# Patient Record
Sex: Female | Born: 1960 | Race: White | Hispanic: No | Marital: Married | Smoking: Light tobacco smoker
Health system: Southern US, Community
[De-identification: ages and names within clinical notes are randomized; demographics above are authoritative.]

## PROBLEM LIST (undated history)

## (undated) DIAGNOSIS — I10 Essential (primary) hypertension: Secondary | ICD-10-CM

## (undated) DIAGNOSIS — Z95820 Peripheral vascular angioplasty status with implants and grafts: Secondary | ICD-10-CM

## (undated) DIAGNOSIS — I2510 Atherosclerotic heart disease of native coronary artery without angina pectoris: Secondary | ICD-10-CM

## (undated) DIAGNOSIS — K219 Gastro-esophageal reflux disease without esophagitis: Secondary | ICD-10-CM

## (undated) DIAGNOSIS — R9439 Abnormal result of other cardiovascular function study: Secondary | ICD-10-CM

## (undated) DIAGNOSIS — J189 Pneumonia, unspecified organism: Secondary | ICD-10-CM

## (undated) DIAGNOSIS — E785 Hyperlipidemia, unspecified: Secondary | ICD-10-CM

## (undated) DIAGNOSIS — J42 Unspecified chronic bronchitis: Secondary | ICD-10-CM

## (undated) DIAGNOSIS — F419 Anxiety disorder, unspecified: Secondary | ICD-10-CM

## (undated) HISTORY — PX: BACK SURGERY: SHX140

## (undated) HISTORY — PX: REFRACTIVE SURGERY: SHX103

## (undated) HISTORY — PX: ANTERIOR CERVICAL DECOMP/DISCECTOMY FUSION: SHX1161

## (undated) HISTORY — PX: TONSILLECTOMY: SUR1361

---

## 1999-06-07 ENCOUNTER — Other Ambulatory Visit: Admission: RE | Admit: 1999-06-07 | Discharge: 1999-06-07 | Payer: Self-pay | Admitting: *Deleted

## 1999-06-07 ENCOUNTER — Encounter (INDEPENDENT_AMBULATORY_CARE_PROVIDER_SITE_OTHER): Payer: Self-pay

## 1999-12-17 ENCOUNTER — Other Ambulatory Visit: Admission: RE | Admit: 1999-12-17 | Discharge: 1999-12-17 | Payer: Self-pay | Admitting: *Deleted

## 2000-12-15 ENCOUNTER — Other Ambulatory Visit: Admission: RE | Admit: 2000-12-15 | Discharge: 2000-12-15 | Payer: Self-pay | Admitting: *Deleted

## 2002-05-10 ENCOUNTER — Other Ambulatory Visit: Admission: RE | Admit: 2002-05-10 | Discharge: 2002-05-10 | Payer: Self-pay | Admitting: *Deleted

## 2003-05-16 ENCOUNTER — Other Ambulatory Visit: Admission: RE | Admit: 2003-05-16 | Discharge: 2003-05-16 | Payer: Self-pay | Admitting: *Deleted

## 2004-09-29 ENCOUNTER — Ambulatory Visit (HOSPITAL_COMMUNITY): Admission: RE | Admit: 2004-09-29 | Discharge: 2004-09-29 | Payer: Self-pay | Admitting: Family Medicine

## 2005-01-28 ENCOUNTER — Other Ambulatory Visit: Admission: RE | Admit: 2005-01-28 | Discharge: 2005-01-28 | Payer: Self-pay | Admitting: Family Medicine

## 2005-03-04 ENCOUNTER — Encounter: Admission: RE | Admit: 2005-03-04 | Discharge: 2005-03-04 | Payer: Self-pay | Admitting: Gastroenterology

## 2006-06-09 ENCOUNTER — Ambulatory Visit (HOSPITAL_COMMUNITY): Admission: RE | Admit: 2006-06-09 | Discharge: 2006-06-10 | Payer: Self-pay | Admitting: Neurosurgery

## 2006-10-26 ENCOUNTER — Encounter: Admission: RE | Admit: 2006-10-26 | Discharge: 2006-10-26 | Payer: Self-pay | Admitting: Neurosurgery

## 2007-12-13 ENCOUNTER — Other Ambulatory Visit: Admission: RE | Admit: 2007-12-13 | Discharge: 2007-12-13 | Payer: Self-pay | Admitting: Family Medicine

## 2009-01-23 ENCOUNTER — Other Ambulatory Visit: Admission: RE | Admit: 2009-01-23 | Discharge: 2009-01-23 | Payer: Self-pay | Admitting: Family Medicine

## 2011-01-07 NOTE — Op Note (Signed)
Rebecca Dalton, Rebecca Dalton                ACCOUNT NO.:  [id number]   MEDICAL RECORD NO.:  [id number]          PATIENT TYPE:  AMB   LOCATION:  SDS                          FACILITY:  [facility]   PHYSICIAN:  Henry A. Pool, M.D.    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   PROCEDURE PERFORMED:  C6-7 anterior cervical diskectomy and fusion with  allograft and plating.   SURGEON:  Kathaleen Maser. Pool, M.D.   Threasa HeadsYetta Barre.   ANESTHESIA:  General endotracheal.   INDICATIONS FOR SURGERY:  [name] is a 50 year old female with a  history  of neck and left upper extremity pain consistent with a left-sided C7  radiculopathy.  The patient has failed conservative management.  She  presents now for C6-7 anterior cervical diskectomy and fusion with allograft  and plating of reversing preoperative patient operative table in supine  position after goal was achieved the patient prone position supine.  Slightly extended held with halter traction.  The anterior cervix.  Paracervical was second incision overlying C6-7 interspace.  This carried  sharply to the platysma.  This then divided vertically section C5 medial  border of sternomastoid muscle and carotid sheath.  Trachea, esophagus were  mobilized turned towards the left.  Prevertebral fascia stripped off  anterior spinal column.  Long close and close then elevated bilaterally.  Deep self self-retaining space entrapped fluoroscopy C6-7 level was  confirmed.  Disk space and a size 15 blade fracture with a space clean-out  was achieved using pituitary rongeurs for a maxillary, curettes, Kerrison  rongeurs, high-speed drill, disk removed down to the posterior annulus.  Microscope was then brought to  the.  The remainder diskectomy were and six  remaining aspects of annulus sides removed down to the posterolateral.  This  also was elevated and resected fashion using Kerrison rongeurs for thecal  sac was identified.  Wide central decompression then performed by  undercutting the bodies of C6-C7 using Kerrison rongeurs.  Decompression  then proceed H each neural foramen.  Wide anterior foraminotomies were  performed along the course of the exiting C7 nerve roots bilaterally.  A  large amount of free.  We ruptured disk herniation off to the left-sided T6-  7 was encountered.  It was resected.  This point a very thorough  decompression then achieved.  This does injury to the thecal sac or nerve  roots.  We then irrigated solution.  A 5 mm Life allograft wedge then packed  into place and recessed proximal 1 mm anterior cortical margin.  25 mm  anterior plate was then placed over the C6-C7 levels.  This attached under  fluoroscopic guidance using 13 mm variable screws to each of both levels.  All four screws given final tightening.  Locking screws engaged at both  levels.  Final images revealed good position.  Femoral proper pole normal spine.  Was then irrigated solution for  hemostasis was achieved with cautery with a bipolar cautery.  Was then  closed typical fashion.  Steri-Strips ureters were applied.   OPERATION:  The patient well and she returns for postoperative care.  Morris  Gaynell Face elements thanks           ______________________________  Kathaleen Maser. Pool, M.D.     HAP/MEDQ  D:  06/09/2006  T:  06/11/2006  Job:  [id number]

## 2012-01-31 ENCOUNTER — Other Ambulatory Visit: Payer: Self-pay | Admitting: Chiropractic Medicine

## 2012-01-31 DIAGNOSIS — M5430 Sciatica, unspecified side: Secondary | ICD-10-CM

## 2012-02-03 ENCOUNTER — Other Ambulatory Visit: Payer: Self-pay

## 2012-02-04 ENCOUNTER — Ambulatory Visit
Admission: RE | Admit: 2012-02-04 | Discharge: 2012-02-04 | Disposition: A | Discharge status: Home / Self Care | Payer: BC Managed Care – PPO | Source: Ambulatory Visit | Attending: Chiropractic Medicine | Admitting: Chiropractic Medicine

## 2012-02-04 DIAGNOSIS — M5430 Sciatica, unspecified side: Secondary | ICD-10-CM

## 2012-02-10 ENCOUNTER — Ambulatory Visit
Admission: RE | Admit: 2012-02-10 | Discharge: 2012-02-10 | Disposition: A | Discharge status: Home / Self Care | Payer: BC Managed Care – PPO | Source: Ambulatory Visit | Attending: Chiropractic Medicine | Admitting: Chiropractic Medicine

## 2012-02-10 ENCOUNTER — Other Ambulatory Visit: Payer: Self-pay | Admitting: Chiropractic Medicine

## 2012-02-10 DIAGNOSIS — M25559 Pain in unspecified hip: Secondary | ICD-10-CM

## 2012-02-17 ENCOUNTER — Other Ambulatory Visit: Payer: Self-pay | Admitting: Family Medicine

## 2012-02-17 ENCOUNTER — Other Ambulatory Visit (HOSPITAL_COMMUNITY)
Admission: RE | Admit: 2012-02-17 | Discharge: 2012-02-17 | Disposition: A | Discharge status: Home / Self Care | Payer: BC Managed Care – PPO | Source: Ambulatory Visit | Attending: Family Medicine | Admitting: Family Medicine

## 2012-02-17 DIAGNOSIS — Z01419 Encounter for gynecological examination (general) (routine) without abnormal findings: Secondary | ICD-10-CM | POA: Insufficient documentation

## 2012-07-23 ENCOUNTER — Other Ambulatory Visit: Payer: Self-pay | Admitting: Family Medicine

## 2012-07-23 DIAGNOSIS — R945 Abnormal results of liver function studies: Secondary | ICD-10-CM

## 2012-07-27 ENCOUNTER — Ambulatory Visit
Admission: RE | Admit: 2012-07-27 | Discharge: 2012-07-27 | Disposition: A | Discharge status: Home / Self Care | Payer: BC Managed Care – PPO | Source: Ambulatory Visit | Attending: Family Medicine | Admitting: Family Medicine

## 2012-07-27 DIAGNOSIS — R945 Abnormal results of liver function studies: Secondary | ICD-10-CM

## 2012-08-10 ENCOUNTER — Other Ambulatory Visit: Payer: Self-pay | Admitting: Family Medicine

## 2012-08-10 ENCOUNTER — Ambulatory Visit
Admission: RE | Admit: 2012-08-10 | Discharge: 2012-08-10 | Disposition: A | Discharge status: Home / Self Care | Payer: BC Managed Care – PPO | Source: Ambulatory Visit | Attending: Family Medicine | Admitting: Family Medicine

## 2012-08-10 DIAGNOSIS — M533 Sacrococcygeal disorders, not elsewhere classified: Secondary | ICD-10-CM

## 2012-08-10 MED ORDER — IOHEXOL 300 MG/ML  SOLN
100.0000 mL | Freq: Once | INTRAMUSCULAR | Status: AC | PRN
  Administered 2012-08-10: 100 mL via INTRAVENOUS

## 2013-09-06 ENCOUNTER — Other Ambulatory Visit: Payer: Self-pay | Admitting: Family Medicine

## 2013-09-06 DIAGNOSIS — R1031 Right lower quadrant pain: Secondary | ICD-10-CM

## 2013-09-13 ENCOUNTER — Ambulatory Visit
Admission: RE | Admit: 2013-09-13 | Discharge: 2013-09-13 | Disposition: A | Discharge status: Home / Self Care | Payer: BC Managed Care – PPO | Source: Ambulatory Visit | Attending: Family Medicine | Admitting: Family Medicine

## 2013-09-13 ENCOUNTER — Ambulatory Visit
Admission: RE | Admit: 2013-09-13 | Discharge: 2013-09-13 | Disposition: A | Discharge status: Home / Self Care | Payer: PRIVATE HEALTH INSURANCE | Source: Ambulatory Visit | Attending: Family Medicine | Admitting: Family Medicine

## 2013-09-13 DIAGNOSIS — R1031 Right lower quadrant pain: Secondary | ICD-10-CM

## 2014-04-22 ENCOUNTER — Other Ambulatory Visit: Payer: Self-pay | Admitting: Family Medicine

## 2014-04-22 DIAGNOSIS — M79605 Pain in left leg: Principal | ICD-10-CM

## 2014-04-22 DIAGNOSIS — M79604 Pain in right leg: Secondary | ICD-10-CM

## 2014-04-30 ENCOUNTER — Ambulatory Visit
Admission: RE | Admit: 2014-04-30 | Discharge: 2014-04-30 | Disposition: A | Discharge status: Home / Self Care | Payer: PRIVATE HEALTH INSURANCE | Source: Ambulatory Visit | Attending: Family Medicine | Admitting: Family Medicine

## 2014-04-30 DIAGNOSIS — M79604 Pain in right leg: Secondary | ICD-10-CM

## 2014-04-30 DIAGNOSIS — M79605 Pain in left leg: Principal | ICD-10-CM

## 2014-08-25 ENCOUNTER — Other Ambulatory Visit (HOSPITAL_COMMUNITY): Payer: Self-pay | Admitting: Gastroenterology

## 2014-08-25 DIAGNOSIS — D75839 Thrombocytosis, unspecified: Secondary | ICD-10-CM

## 2014-08-25 DIAGNOSIS — D473 Essential (hemorrhagic) thrombocythemia: Secondary | ICD-10-CM

## 2014-08-25 DIAGNOSIS — R748 Abnormal levels of other serum enzymes: Secondary | ICD-10-CM

## 2014-10-15 ENCOUNTER — Ambulatory Visit (HOSPITAL_COMMUNITY)
Admission: RE | Admit: 2014-10-15 | Discharge: 2014-10-15 | Disposition: A | Discharge status: Home / Self Care | Payer: PRIVATE HEALTH INSURANCE | Source: Ambulatory Visit | Attending: Gastroenterology | Admitting: Gastroenterology

## 2014-10-15 DIAGNOSIS — D75839 Thrombocytosis, unspecified: Secondary | ICD-10-CM

## 2014-10-15 DIAGNOSIS — D473 Essential (hemorrhagic) thrombocythemia: Secondary | ICD-10-CM | POA: Insufficient documentation

## 2014-10-15 DIAGNOSIS — R748 Abnormal levels of other serum enzymes: Secondary | ICD-10-CM

## 2014-10-17 ENCOUNTER — Other Ambulatory Visit (HOSPITAL_COMMUNITY)
Admission: RE | Admit: 2014-10-17 | Discharge: 2014-10-17 | Disposition: A | Discharge status: Home / Self Care | Payer: PRIVATE HEALTH INSURANCE | Source: Ambulatory Visit | Attending: Family Medicine | Admitting: Family Medicine

## 2014-10-17 ENCOUNTER — Other Ambulatory Visit: Payer: Self-pay | Admitting: Family Medicine

## 2014-10-17 DIAGNOSIS — Z124 Encounter for screening for malignant neoplasm of cervix: Secondary | ICD-10-CM | POA: Insufficient documentation

## 2014-10-20 LAB — CYTOLOGY - PAP

## 2015-07-31 ENCOUNTER — Other Ambulatory Visit: Payer: Self-pay | Admitting: Gastroenterology

## 2016-04-18 ENCOUNTER — Encounter (HOSPITAL_BASED_OUTPATIENT_CLINIC_OR_DEPARTMENT_OTHER): Payer: Self-pay | Admitting: *Deleted

## 2016-04-20 ENCOUNTER — Encounter (HOSPITAL_BASED_OUTPATIENT_CLINIC_OR_DEPARTMENT_OTHER)
Admission: RE | Admit: 2016-04-20 | Discharge: 2016-04-20 | Disposition: A | Discharge status: Home / Self Care | Payer: PRIVATE HEALTH INSURANCE | Source: Ambulatory Visit | Attending: Orthopedic Surgery | Admitting: Orthopedic Surgery

## 2016-04-20 ENCOUNTER — Other Ambulatory Visit: Payer: Self-pay

## 2016-04-20 DIAGNOSIS — M24021 Loose body in right elbow: Secondary | ICD-10-CM | POA: Diagnosis not present

## 2016-04-20 DIAGNOSIS — F17200 Nicotine dependence, unspecified, uncomplicated: Secondary | ICD-10-CM | POA: Diagnosis not present

## 2016-04-20 DIAGNOSIS — K219 Gastro-esophageal reflux disease without esophagitis: Secondary | ICD-10-CM | POA: Diagnosis not present

## 2016-04-20 DIAGNOSIS — I10 Essential (primary) hypertension: Secondary | ICD-10-CM | POA: Diagnosis present

## 2016-04-20 DIAGNOSIS — F419 Anxiety disorder, unspecified: Secondary | ICD-10-CM | POA: Diagnosis not present

## 2016-04-20 DIAGNOSIS — Z880 Allergy status to penicillin: Secondary | ICD-10-CM | POA: Diagnosis not present

## 2016-04-20 DIAGNOSIS — S52121A Displaced fracture of head of right radius, initial encounter for closed fracture: Secondary | ICD-10-CM | POA: Diagnosis present

## 2016-04-20 DIAGNOSIS — W19XXXA Unspecified fall, initial encounter: Secondary | ICD-10-CM | POA: Diagnosis not present

## 2016-04-20 LAB — BASIC METABOLIC PANEL
Anion gap: 7 (ref 5–15)
BUN: 8 mg/dL (ref 6–20)
CO2: 31 mmol/L (ref 22–32)
Calcium: 9.8 mg/dL (ref 8.9–10.3)
Chloride: 100 mmol/L — ABNORMAL LOW (ref 101–111)
Creatinine, Ser: 0.69 mg/dL (ref 0.44–1.00)
GFR calc Af Amer: >60 mL/min (ref >60)
GFR calc non Af Amer: >60 mL/min (ref >60)
Glucose, Bld: 190 mg/dL — ABNORMAL HIGH (ref 65–99)
Potassium: 4.1 mmol/L (ref 3.5–5.1)
Sodium: 138 mmol/L (ref 135–145)

## 2016-05-02 ENCOUNTER — Other Ambulatory Visit: Payer: Self-pay | Admitting: Orthopedic Surgery

## 2016-05-03 ENCOUNTER — Encounter (HOSPITAL_BASED_OUTPATIENT_CLINIC_OR_DEPARTMENT_OTHER)
Admission: RE | Disposition: A | Discharge status: Home / Self Care | Payer: Self-pay | Source: Ambulatory Visit | Attending: Orthopedic Surgery

## 2016-05-03 ENCOUNTER — Ambulatory Visit (HOSPITAL_BASED_OUTPATIENT_CLINIC_OR_DEPARTMENT_OTHER): Payer: PRIVATE HEALTH INSURANCE | Admitting: Certified Registered"

## 2016-05-03 ENCOUNTER — Encounter (HOSPITAL_BASED_OUTPATIENT_CLINIC_OR_DEPARTMENT_OTHER): Payer: Self-pay | Admitting: *Deleted

## 2016-05-03 ENCOUNTER — Ambulatory Visit (HOSPITAL_BASED_OUTPATIENT_CLINIC_OR_DEPARTMENT_OTHER)
Admission: RE | Admit: 2016-05-03 | Discharge: 2016-05-03 | Disposition: A | Discharge status: Home / Self Care | Payer: PRIVATE HEALTH INSURANCE | Source: Ambulatory Visit | Attending: Orthopedic Surgery | Admitting: Orthopedic Surgery

## 2016-05-03 DIAGNOSIS — F419 Anxiety disorder, unspecified: Secondary | ICD-10-CM | POA: Insufficient documentation

## 2016-05-03 DIAGNOSIS — K219 Gastro-esophageal reflux disease without esophagitis: Secondary | ICD-10-CM | POA: Insufficient documentation

## 2016-05-03 DIAGNOSIS — I10 Essential (primary) hypertension: Secondary | ICD-10-CM | POA: Insufficient documentation

## 2016-05-03 DIAGNOSIS — F17200 Nicotine dependence, unspecified, uncomplicated: Secondary | ICD-10-CM | POA: Insufficient documentation

## 2016-05-03 DIAGNOSIS — S52121A Displaced fracture of head of right radius, initial encounter for closed fracture: Secondary | ICD-10-CM | POA: Insufficient documentation

## 2016-05-03 DIAGNOSIS — Z880 Allergy status to penicillin: Secondary | ICD-10-CM | POA: Insufficient documentation

## 2016-05-03 DIAGNOSIS — W19XXXA Unspecified fall, initial encounter: Secondary | ICD-10-CM | POA: Insufficient documentation

## 2016-05-03 DIAGNOSIS — M24021 Loose body in right elbow: Secondary | ICD-10-CM | POA: Insufficient documentation

## 2016-05-03 HISTORY — PX: ELBOW ARTHROSCOPY: SHX614

## 2016-05-03 HISTORY — DX: Essential (primary) hypertension: I10

## 2016-05-03 HISTORY — DX: Anxiety disorder, unspecified: F41.9

## 2016-05-03 HISTORY — DX: Gastro-esophageal reflux disease without esophagitis: K21.9

## 2016-05-03 SURGERY — ARTHROSCOPY, ELBOW, WITH OPEN SURGERY IF INDICATED
Anesthesia: Monitor Anesthesia Care | Site: Elbow | Laterality: Right

## 2016-05-03 MED ORDER — SUCCINYLCHOLINE CHLORIDE 20 MG/ML IJ SOLN
INTRAMUSCULAR | Status: DC | PRN
  Administered 2016-05-03: 100 mg via INTRAVENOUS

## 2016-05-03 MED ORDER — ONDANSETRON HCL 4 MG/2ML IJ SOLN
INTRAMUSCULAR | Status: DC | PRN
  Administered 2016-05-03: 4 mg via INTRAVENOUS

## 2016-05-03 MED ORDER — PROMETHAZINE HCL 25 MG/ML IJ SOLN: 6.2500 mg | INTRAMUSCULAR | Status: DC | PRN

## 2016-05-03 MED ORDER — FENTANYL CITRATE (PF) 100 MCG/2ML IJ SOLN: 25.0000 ug | INTRAMUSCULAR | Status: DC | PRN

## 2016-05-03 MED ORDER — HYDROCODONE-ACETAMINOPHEN 5-325 MG PO TABS: ORAL_TABLET | ORAL | 0 refills | Status: DC

## 2016-05-03 MED ORDER — LACTATED RINGERS IV SOLN
INTRAVENOUS | Status: DC
  Administered 2016-05-03: 15:00:00 via INTRAVENOUS
  Administered 2016-05-03: 10 mL/h via INTRAVENOUS

## 2016-05-03 MED ORDER — PROPOFOL 10 MG/ML IV BOLUS
INTRAVENOUS | Status: DC | PRN
  Administered 2016-05-03: 200 mg via INTRAVENOUS

## 2016-05-03 MED ORDER — MIDAZOLAM HCL 2 MG/2ML IJ SOLN
INTRAMUSCULAR | Status: AC
  Filled 2016-05-03: qty 2

## 2016-05-03 MED ORDER — LIDOCAINE 2% (20 MG/ML) 5 ML SYRINGE
INTRAMUSCULAR | Status: AC
  Filled 2016-05-03: qty 5

## 2016-05-03 MED ORDER — DEXAMETHASONE SODIUM PHOSPHATE 4 MG/ML IJ SOLN
INTRAMUSCULAR | Status: DC | PRN
  Administered 2016-05-03: 10 mg via INTRAVENOUS

## 2016-05-03 MED ORDER — VANCOMYCIN HCL IN DEXTROSE 1-5 GM/200ML-% IV SOLN
INTRAVENOUS | Status: AC
  Filled 2016-05-03: qty 200

## 2016-05-03 MED ORDER — ROPIVACAINE HCL 7.5 MG/ML IJ SOLN
INTRAMUSCULAR | Status: DC | PRN
  Administered 2016-05-03: 20 mL via PERINEURAL

## 2016-05-03 MED ORDER — SCOPOLAMINE 1 MG/3DAYS TD PT72: 1.0000 | MEDICATED_PATCH | Freq: Once | TRANSDERMAL | Status: DC | PRN

## 2016-05-03 MED ORDER — MIDAZOLAM HCL 2 MG/2ML IJ SOLN
1.0000 mg | INTRAMUSCULAR | Status: DC | PRN
  Administered 2016-05-03: 1 mg via INTRAVENOUS

## 2016-05-03 MED ORDER — CHLORHEXIDINE GLUCONATE 4 % EX LIQD: 60.0000 mL | Freq: Once | CUTANEOUS | Status: DC

## 2016-05-03 MED ORDER — ROCURONIUM BROMIDE 10 MG/ML (PF) SYRINGE
PREFILLED_SYRINGE | INTRAVENOUS | Status: AC
  Filled 2016-05-03: qty 10

## 2016-05-03 MED ORDER — ONDANSETRON HCL 4 MG PO TABS: 4.0000 mg | ORAL_TABLET | Freq: Three times a day (TID) | ORAL | 0 refills | Status: DC | PRN

## 2016-05-03 MED ORDER — FENTANYL CITRATE (PF) 100 MCG/2ML IJ SOLN
INTRAMUSCULAR | Status: AC
  Filled 2016-05-03: qty 2

## 2016-05-03 MED ORDER — ONDANSETRON HCL 4 MG/2ML IJ SOLN
INTRAMUSCULAR | Status: AC
  Filled 2016-05-03: qty 2

## 2016-05-03 MED ORDER — FENTANYL CITRATE (PF) 100 MCG/2ML IJ SOLN
50.0000 ug | INTRAMUSCULAR | Status: DC | PRN
  Administered 2016-05-03: 50 ug via INTRAVENOUS

## 2016-05-03 MED ORDER — LIDOCAINE HCL (CARDIAC) 20 MG/ML IV SOLN
INTRAVENOUS | Status: DC | PRN
  Administered 2016-05-03: 30 mg via INTRAVENOUS

## 2016-05-03 MED ORDER — VANCOMYCIN HCL IN DEXTROSE 1-5 GM/200ML-% IV SOLN
1000.0000 mg | INTRAVENOUS | Status: AC
Start: 2016-05-04 — End: 2016-05-03
  Administered 2016-05-03: 1000 mg via INTRAVENOUS

## 2016-05-03 MED ORDER — EPHEDRINE 5 MG/ML INJ
INTRAVENOUS | Status: AC
  Filled 2016-05-03: qty 10

## 2016-05-03 MED ORDER — EPHEDRINE SULFATE 50 MG/ML IJ SOLN
INTRAMUSCULAR | Status: DC | PRN
  Administered 2016-05-03 (×4): 10 mg via INTRAVENOUS

## 2016-05-03 MED ORDER — GLYCOPYRROLATE 0.2 MG/ML IJ SOLN: 0.2000 mg | Freq: Once | INTRAMUSCULAR | Status: DC | PRN

## 2016-05-03 SURGICAL SUPPLY — 54 items
BANDAGE ACE 3X5.8 VEL STRL LF (GAUZE/BANDAGES/DRESSINGS) ×6 IMPLANT
BLADE CUTTER GATOR 3.5 (BLADE) IMPLANT
BLADE GREAT WHITE 4.2 (BLADE) IMPLANT
BLADE GREAT WHITE 4.2MM (BLADE)
BLADE SURG 15 STRL LF DISP TIS (BLADE) ×1 IMPLANT
BLADE SURG 15 STRL SS (BLADE) ×2
BNDG ESMARK 4X9 LF (GAUZE/BANDAGES/DRESSINGS) IMPLANT
BNDG GAUZE ELAST 4 BULKY (GAUZE/BANDAGES/DRESSINGS) ×3 IMPLANT
BUR CUDA 2.9 (BURR) IMPLANT
BUR CUDA 2.9MM (BURR)
BUR FULL RADIUS 2.9 (BURR) IMPLANT
BUR FULL RADIUS 2.9MM (BURR)
BUR GATOR 2.9 (BURR) IMPLANT
BUR GATOR 2.9MM (BURR)
BUR OVAL 4.0 (BURR) IMPLANT
BUR SPHERICAL 2.9 (BURR) IMPLANT
BUR SPHERICAL 2.9MM (BURR)
CHLORAPREP W/TINT 26ML (MISCELLANEOUS) ×3 IMPLANT
CUFF TOURNIQUET SINGLE 18IN (TOURNIQUET CUFF) IMPLANT
DRAPE ARTHROSCOPY W/POUCH 114 (DRAPES) ×3 IMPLANT
DRAPE OEC MINIVIEW 54X84 (DRAPES) IMPLANT
GAUZE SPONGE 4X4 12PLY STRL (GAUZE/BANDAGES/DRESSINGS) ×3 IMPLANT
GAUZE XEROFORM 1X8 LF (GAUZE/BANDAGES/DRESSINGS) ×3 IMPLANT
GLOVE BIO SURGEON STRL SZ7.5 (GLOVE) ×3 IMPLANT
GLOVE BIOGEL PI IND STRL 8 (GLOVE) ×1 IMPLANT
GLOVE BIOGEL PI IND STRL 8.5 (GLOVE) ×1 IMPLANT
GLOVE BIOGEL PI INDICATOR 8 (GLOVE) ×2
GLOVE BIOGEL PI INDICATOR 8.5 (GLOVE) ×2
GLOVE SURG ORTHO 8.0 STRL STRW (GLOVE) ×3 IMPLANT
GOWN STRL REUS W/ TWL LRG LVL3 (GOWN DISPOSABLE) ×1 IMPLANT
GOWN STRL REUS W/TWL LRG LVL3 (GOWN DISPOSABLE) ×2
GOWN STRL REUS W/TWL XL LVL3 (GOWN DISPOSABLE) ×3 IMPLANT
NDL SAFETY ECLIPSE 18X1.5 (NEEDLE) IMPLANT
NEEDLE HYPO 18GX1.5 SHARP (NEEDLE)
PACK ARTHROSCOPY DSU (CUSTOM PROCEDURE TRAY) ×3 IMPLANT
PACK BASIN DAY SURGERY FS (CUSTOM PROCEDURE TRAY) ×3 IMPLANT
PAD CAST 3X4 CTTN HI CHSV (CAST SUPPLIES) ×2 IMPLANT
PADDING CAST ABS 3INX4YD NS (CAST SUPPLIES)
PADDING CAST ABS COTTON 3X4 (CAST SUPPLIES) IMPLANT
PADDING CAST COTTON 3X4 STRL (CAST SUPPLIES) ×4
RESECTOR FULL RADIUS 4.2MM (BLADE) IMPLANT
SET ARTHROSCOPY TUBING (MISCELLANEOUS) ×2
SET ARTHROSCOPY TUBING LN (MISCELLANEOUS) ×1 IMPLANT
SHEET MEDIUM DRAPE 40X70 STRL (DRAPES) ×3 IMPLANT
SLEEVE SCD COMPRESS KNEE MED (MISCELLANEOUS) ×3 IMPLANT
SPLINT PLASTER CAST XFAST 3X15 (CAST SUPPLIES) ×30 IMPLANT
SPLINT PLASTER XTRA FASTSET 3X (CAST SUPPLIES) ×60
SUT ETHILON 4 0 PS 2 18 (SUTURE) ×3 IMPLANT
SUT VIC AB 2-0 SH 27 (SUTURE)
SUT VIC AB 2-0 SH 27XBRD (SUTURE) IMPLANT
SYR BULB 3OZ (MISCELLANEOUS) IMPLANT
TOWEL OR 17X24 6PK STRL BLUE (TOWEL DISPOSABLE) ×3 IMPLANT
WAND STAR VAC 90 (SURGICAL WAND) IMPLANT
WATER STERILE IRR 1000ML POUR (IV SOLUTION) ×3 IMPLANT

## 2016-05-03 NOTE — Transfer of Care (Signed)
Immediate Anesthesia Transfer of Care Note  Patient: Rebecca Dalton  Procedure(s) Performed: Procedure(s): RIGHT ARTHROSCOPY ELBOW WITH REMOVAL FOREIGN BODIES (Right)  Patient Location: PACU  Anesthesia Type:GA combined with regional for post-op pain  Level of Consciousness: awake, alert , oriented and patient cooperative  Airway & Oxygen Therapy: Patient Spontanous Breathing and Patient connected to face mask oxygen  Post-op Assessment: Report given to RN and Post -op Vital signs reviewed and stable  Post vital signs: Reviewed and stable  Last Vitals:  Vitals:   05/03/16 1255 05/03/16 1300  BP:  102/73  Pulse: 65 69  Resp: 15 15  Temp:      Last Pain:  Vitals:   05/03/16 1209  TempSrc: Oral         Complications: No apparent anesthesia complications

## 2016-05-03 NOTE — Anesthesia Procedure Notes (Signed)
Procedure Name: Intubation Date/Time: 05/03/2016 1:50 PM Performed by: Gaia Gullikson D Pre-anesthesia Checklist: Patient identified, Emergency Drugs available, Suction available and Patient being monitored Patient Re-evaluated:Patient Re-evaluated prior to inductionOxygen Delivery Method: Circle system utilized Preoxygenation: Pre-oxygenation with 100% oxygen Intubation Type: IV induction Ventilation: Mask ventilation without difficulty Laryngoscope Size: Mac and 3 Grade View: Grade II Tube type: Oral Tube size: 7.0 mm Number of attempts: 1 Airway Equipment and Method: Stylet and Oral airway Placement Confirmation: ETT inserted through vocal cords under direct vision,  positive ETCO2 and breath sounds checked- equal and bilateral Secured at: 21 cm Tube secured with: Tape Dental Injury: Teeth and Oropharynx as per pre-operative assessment

## 2016-05-03 NOTE — Discharge Instructions (Addendum)

## 2016-05-03 NOTE — Op Note (Signed)
[id number] 

## 2016-05-03 NOTE — Progress Notes (Signed)
Assisted Dr. Gifford Shave with right, ultrasound guided, block. Side rails up, monitors on throughout procedure. See vital signs in flow sheet. Tolerated Procedure well.

## 2016-05-03 NOTE — H&P (Signed)
  Rebecca Dalton is an 55 y.o. female.   Chief Complaint: right elbow loose body HPI: 56 yo female sustained right radial head fracture in a fall approximately 6 weeks ago.  Loose fragments noted on XR and CT.  She wishes to have elbow arthroscopy for removal of loose fragments.  Allergies:  Allergies  Allergen Reactions  . Penicillins Shortness Of Breath  . Codeine Nausea Only  . Sulfa Antibiotics Hives    Past Medical History:  Diagnosis Date  . Anxiety   . GERD (gastroesophageal reflux disease)   . Hypertension     Past Surgical History:  Procedure Laterality Date  . TONSILLECTOMY      Family History: History reviewed. No pertinent family history.  Social History:   reports that she has been smoking.  She has been smoking about 0.50 packs per day. She has never used smokeless tobacco. She reports that she drinks alcohol. Her drug history is not on file.  Medications: Medications Prior to Admission  Medication Sig Dispense Refill  . amLODipine (NORVASC) 2.5 MG tablet Take 2.5 mg by mouth daily.    . carvedilol (COREG) 25 MG tablet Take 25 mg by mouth 2 (two) times daily with a meal.    . estradiol (VIVELLE-DOT) 0.05 MG/24HR patch Place 1 patch onto the skin 2 (two) times a week.    . hydrochlorothiazide (MICROZIDE) 12.5 MG capsule Take 12.5 mg by mouth daily.    Marland Kitchen LORazepam (ATIVAN) 0.5 MG tablet Take 0.5 mg by mouth every 8 (eight) hours.    . Multiple Vitamin (MULTIVITAMIN WITH MINERALS) TABS tablet Take 1 tablet by mouth daily.    Marland Kitchen omeprazole (PRILOSEC) 20 MG capsule Take 20 mg by mouth daily.    . progesterone (PROMETRIUM) 100 MG capsule Take 100 mg by mouth daily.    . valsartan (DIOVAN) 320 MG tablet Take 320 mg by mouth daily.      No results found for this or any previous visit (from the past 48 hour(s)).  No results found.   A comprehensive review of systems was negative.  Blood pressure 102/73, pulse 69, temperature 99.3 F (37.4 C), temperature source  Oral, resp. rate 15, height 5\' 3"  (1.6 m), weight 73.6 kg (162 lb 3.2 oz), SpO2 100 %.  General appearance: alert, cooperative and appears stated age Head: Normocephalic, without obvious abnormality, atraumatic Neck: supple, symmetrical, trachea midline Resp: clear to auscultation bilaterally Cardio: regular rate and rhythm GI: non-tender Extremities: Intact sensation and capillary refill all digits.  +epl/fpl/io.  No wounds.  Pulses: 2+ and symmetric Skin: Skin color, texture, turgor normal. No rashes or lesions Neurologic: Grossly normal Incision/Wound:none  Assessment/Plan Right elbow loose bodies.  Plan elbow arthroscopy for removal loose bodies, possible open if necessary.  Non operative and operative treatment options were discussed with the patient and patient wishes to proceed with operative treatment. Risks, benefits, and alternatives of surgery were discussed and the patient agrees with the plan of care.   Aimi Essner R 05/03/2016, 1:19 PM

## 2016-05-03 NOTE — Op Note (Signed)
I assisted Surgeon(s) and Role:    * Leanora Cover, MD - Primary    * Daryll Brod, MD - Assisting on the Procedure(s): RIGHT ARTHROSCOPY ELBOW WITH REMOVAL FOREIGN BODIES on 05/03/2016.  I provided assistance on this case as follows: set up,placement of portals,identification of lesions. Removal of loose fragments, closure of portals and application of splints. I was present for the entire case.  Electronically signed by: Wynonia Sours, MD Date: 05/03/2016 Time: 3:02 PM

## 2016-05-03 NOTE — Anesthesia Procedure Notes (Signed)
Anesthesia Regional Block:  Supraclavicular block  Pre-Anesthetic Checklist: ,, timeout performed, Correct Patient, Correct Site, Correct Laterality, Correct Procedure, Correct Position, site marked, Risks and benefits discussed,  Surgical consent,  Pre-op evaluation,  At surgeon's request and post-op pain management  Laterality: Right  Prep: chloraprep       Needles:  Injection technique: Single-shot  Needle Type: Echogenic Needle     Needle Length: 9cm 9 cm Needle Gauge: 21 and 21 G    Additional Needles:  Procedures: ultrasound guided (picture in chart) Supraclavicular block Narrative:  Injection made incrementally with aspirations every 5 mL.  Performed by: Personally  Anesthesiologist: Catalina Gravel  Additional Notes: No pain on injection. No increased resistance to injection. Injection made in 5cc increments.  Good needle visualization.  Patient tolerated procedure well.

## 2016-05-03 NOTE — Anesthesia Preprocedure Evaluation (Addendum)
Anesthesia Evaluation  Patient identified by MRN, date of birth, ID band Patient awake    Reviewed: Allergy & Precautions, NPO status , Patient's Chart, lab work & pertinent test results, reviewed documented beta blocker date and time   Airway Mallampati: II  TM Distance: >3 FB Neck ROM: Full    Dental  (+) Teeth Intact, Dental Advisory Given, Caps, Implants,    Pulmonary neg pulmonary ROS, Current Smoker,    Pulmonary exam normal breath sounds clear to auscultation       Cardiovascular hypertension, Pt. on medications and Pt. on home beta blockers Normal cardiovascular exam Rhythm:Regular Rate:Normal     Neuro/Psych PSYCHIATRIC DISORDERS Anxiety negative neurological ROS     GI/Hepatic Neg liver ROS, GERD  Medicated,  Endo/Other  negative endocrine ROS  Renal/GU negative Renal ROS     Musculoskeletal negative musculoskeletal ROS (+)   Abdominal   Peds  Hematology negative hematology ROS (+)   Anesthesia Other Findings Day of surgery medications reviewed with the patient.  Reproductive/Obstetrics                            Anesthesia Physical Anesthesia Plan  ASA: II  Anesthesia Plan: Regional and General   Post-op Pain Management:  Regional for Post-op pain   Induction: Intravenous  Airway Management Planned: Oral ETT  Additional Equipment:   Intra-op Plan:   Post-operative Plan: Extubation in OR  Informed Consent: I have reviewed the patients History and Physical, chart, labs and discussed the procedure including the risks, benefits and alternatives for the proposed anesthesia with the patient or authorized representative who has indicated his/her understanding and acceptance.   Dental advisory given  Plan Discussed with: CRNA  Anesthesia Plan Comments: (Risks/benefits of general anesthesia discussed with patient including risk of damage to teeth, lips, gum, and tongue,  nausea/vomiting, allergic reactions to medications, and the possibility of heart attack, stroke and death.  All patient questions answered.  Patient wishes to proceed.  Discussed risks and benefits of supraclavicular nerve block including failure, bleeding, infection, nerve damage, weakness, shortness of breath, pneumothorax. Questions answered. Patient consents to block. )      Anesthesia Quick Evaluation

## 2016-05-03 NOTE — Brief Op Note (Signed)
05/03/2016  2:59 PM  PATIENT:  Rebecca Dalton  55 y.o. female  PRE-OPERATIVE DIAGNOSIS:  right elbow foreign bodies  POST-OPERATIVE DIAGNOSIS:  Right Elbow Foreign Bodies  PROCEDURE:  Procedure(s): RIGHT ARTHROSCOPY ELBOW WITH REMOVAL FOREIGN BODIES (Right)  SURGEON:  Surgeon(s) and Role:    * Leanora Cover, MD - Primary    * Daryll Brod, MD - Assisting  PHYSICIAN ASSISTANT:   ASSISTANTS: Daryll Brod, MD   ANESTHESIA:   regional and general  EBL:  Total I/O In: 1200 [I.V.:1200] Out: -   BLOOD ADMINISTERED:none  DRAINS: none   LOCAL MEDICATIONS USED:  NONE  SPECIMEN:  No Specimen  DISPOSITION OF SPECIMEN:  N/A  COUNTS:  YES  TOURNIQUET:    DICTATION: .Other Dictation: Dictation Number [id number]  PLAN OF CARE: Discharge to home after PACU  PATIENT DISPOSITION:  PACU - hemodynamically stable.

## 2016-05-03 NOTE — Anesthesia Postprocedure Evaluation (Signed)
Anesthesia Post Note  Patient: [name]  Procedure(s) Performed: Procedure(s) (LRB): RIGHT ARTHROSCOPY ELBOW WITH REMOVAL FOREIGN BODIES (Right)  Patient location during evaluation: PACU Anesthesia Type: General and Regional Level of consciousness: awake and alert Pain management: pain level controlled Vital Signs Assessment: post-procedure vital signs reviewed and stable Respiratory status: spontaneous breathing, nonlabored ventilation, respiratory function stable and patient connected to nasal cannula oxygen Cardiovascular status: blood pressure returned to baseline and stable Postop Assessment: no signs of nausea or vomiting Anesthetic complications: no    Last Vitals:  Vitals:   05/03/16 1545 05/03/16 1615  BP: 129/71 132/82  Pulse: 71 74  Resp: 11 18  Temp:  36.5 C    Last Pain:  Vitals:   05/03/16 1615  TempSrc:   PainSc: 0-No pain                 Catalina Gravel

## 2016-05-04 ENCOUNTER — Encounter (HOSPITAL_BASED_OUTPATIENT_CLINIC_OR_DEPARTMENT_OTHER): Payer: Self-pay | Admitting: Orthopedic Surgery

## 2016-05-04 NOTE — Addendum Note (Signed)
Addendum  created 05/04/16 0901 by Marrianne Mood, CRNA   Charge Capture section accepted

## 2016-05-04 NOTE — Op Note (Signed)
Rebecca Dalton, Rebecca Dalton  LOCATION:                                 FACILITY:  PHYSICIAN:  Leanora Cover, MD             DATE OF BIRTH:  DATE OF PROCEDURE:  05/03/2016 DATE OF DISCHARGE:                              OPERATIVE REPORT   PREOPERATIVE DIAGNOSIS:  Right elbow radial head fracture with intraarticular loose fragments.  POSTOPERATIVE DIAGNOSIS:  Right elbow radial head fracture with intraarticular loose fragments.  PROCEDURE:  Right elbow arthroscopy with removal of loose bodies.  SURGEON:  Leanora Cover, MD  ASSISTANT:  Daryll Brod, MD  ANESTHESIA:  General with regional.  IV FLUIDS:  Per anesthesia flow sheet.  ESTIMATED BLOOD LOSS:  Minimal.  COMPLICATIONS:  None.  SPECIMENS:  None.  TIME OF TOURNIQUET:  None.  DISPOSITION:  Stable to PACU.  INDICATIONS:  Ms. Golde is a 55 year old female, who approximately 6 weeks ago fell sustaining a right small finger proximal phalanx fracture and right radial head fracture.  This has been treated nonoperatively. She has noted a mechanical impingement in her motion.  Radiographs showed 2 loose fragments.  We discussed surgical removal with arthroscopy.  Risks, benefits, and alternatives of surgery were discussed including the risk of blood loss; infection; damage to nerves, vessels, tendons, ligaments, bone; failure of surgery; need for additional surgery; complications with wound healing; continued pain; continued stiffness; and retained loose body.  She voiced understanding of these risks and elected to proceed.  OPERATIVE COURSE:  After being identified preoperatively by myself, the patient and I agreed upon procedure and site of procedure.  Surgical site was marked.  The risks, benefits, and alternatives of surgery were reviewed and she wished to proceed.  Surgical consent had been signed. She was given IV antibiotics as preoperative antibiotic  prophylaxis. She was transferred to the operating room and placed on the operating room table in supine position.  General anesthesia induced by Anesthesiology.  A regional block had been performed by Anesthesia in preoperative holding.  She was placed into the decubitus position and appropriately padded.  The right arm was placed in an arm holder.  Right upper extremity was prepped and draped in normal sterile orthopedic fashion.  Surgical pause was performed between surgeons, anesthesia, and operating staff and all are in agreement with the patient, procedure, and site of procedure.  Tourniquet was placed at the proximal aspect of the extremity, but never inflated.  A portal was made at the medial side of the elbow proximally.  The subcutaneous tissues were entered by spreading technique.  The intermuscular septum was palpated with the instrument.  The joint was then insufflated with 10 mL of sterile saline from the lateral side.  The trocar was advanced anterior to the intermuscular septum from the medial side into the joint.  A camera was introduced.  An outflow needle was placed.  The joint was inspected. The radial head fracture was able to be visualized.  There was impaction of the portion of the fracture that appeared to be filling with fibrocartilage.  The overall contour of the  radial head was appropriate. No other fractures noted.  There was some damage to the capitellum. Posterior lateral portal was made.  A smaller camera was introduced. The loose body was noted here.  An additional loose body was able to be visualized.  The radial head was visualized again and the fracture noted.  There was some damage to the capitellar articular surface noted here as well without frank fracture.  An additional portal was made and a grasper introduced.  Both loose bodies were able to be removed.  The joint was inspected, no remaining loose body was noted.  The arthroscopy equipment was  removed.  The elbow was placed through range of motion. She had full pronation and supination.  She lacked approximately 25 degrees of full extension and was able to flex well past 90 degrees. There was no crepitance noted.  The wounds were then closed with 4-0 nylon in a horizontal mattress fashion.  They were dressed with sterile Xeroform, 4x4s, and ABD and wrapped with a Kerlix bandage.  A posterior splint was placed and wrapped with Kerlix and Ace bandage.  This included the small finger for protection of the proximal phalanx fracture.  The operative drapes were broken down, and the patient was awakened from anesthesia safely.  She was transferred back to stretcher and taken to PACU in stable condition.  I will see her back in the office in 1 week for postoperative followup.  She can take the splint off in 2-4 days and start doing range of motion exercises if she is comfortable.  I will prescribe her Norco 5/325, 1-2 p.o. q.6 hours p.r.n. pain, dispense #30 and Zofran 4 mg 1 p.o. q.8 hours p.r.n. nausea, dispense #20.     Leanora Cover, MD     KK/MEDQ  D:  05/03/2016  T:  05/04/2016  Job:  [id number]

## 2016-05-25 ENCOUNTER — Other Ambulatory Visit (HOSPITAL_COMMUNITY): Payer: Self-pay | Admitting: Physician Assistant

## 2016-05-25 DIAGNOSIS — R1011 Right upper quadrant pain: Secondary | ICD-10-CM

## 2016-05-25 DIAGNOSIS — R110 Nausea: Secondary | ICD-10-CM

## 2016-05-31 ENCOUNTER — Encounter (HOSPITAL_COMMUNITY)
Admission: RE | Admit: 2016-05-31 | Discharge: 2016-05-31 | Disposition: A | Discharge status: Home / Self Care | Payer: PRIVATE HEALTH INSURANCE | Source: Ambulatory Visit | Attending: Physician Assistant | Admitting: Physician Assistant

## 2016-05-31 DIAGNOSIS — R110 Nausea: Secondary | ICD-10-CM | POA: Diagnosis present

## 2016-05-31 DIAGNOSIS — R1011 Right upper quadrant pain: Secondary | ICD-10-CM | POA: Diagnosis present

## 2016-05-31 MED ORDER — TECHNETIUM TC 99M MEBROFENIN IV KIT
5.0000 | PACK | Freq: Once | INTRAVENOUS | Status: AC | PRN
  Administered 2016-05-31: 5 via INTRAVENOUS

## 2016-09-12 ENCOUNTER — Telehealth: Payer: Self-pay | Admitting: Internal Medicine

## 2016-09-21 NOTE — Telephone Encounter (Signed)
Close encounter 

## 2016-09-27 NOTE — Progress Notes (Addendum)
Cardiology Office Note   Date:  09/29/2016   ID:  DEYA BEADLE, DOB 02-02-1961, MRN [id number]  PCP:  Vidal Schwalbe, MD  Cardiologist:   Dorris Carnes, MD    Pt Self referred for chest tightness, sob     History of Present Illness: Rebecca Dalton is a 56 y.o. female with a history of HTN, N/  Seen in internal medicine  BP was 120/80  Pt C/O SOB Had resp problem in Dec/Jan  Then weather got cold  Hurt to breathe  Then when warmer  Went and walked  Had to stop and get breath  Something squeezing with jaw pani  SInce then hasnt get anythnig length    Coming in from parking lot had chest tightness  Hurts to breathe  Cant breathe   NO wheezing excpet when sick    Note pt had nuclear stress test over 12 years ago Ordered by Linard Millers    Current Meds  Medication Sig  . amLODipine (NORVASC) 2.5 MG tablet Take 2.5 mg by mouth daily.  . carvedilol (COREG) 25 MG tablet Take 25 mg by mouth 2 (two) times daily with a meal.  . cyclobenzaprine (FLEXERIL) 10 MG tablet TAKE 1/2 TO 1 TABLET 3 TIMES A DAY AS NEEDED 10  . hydrochlorothiazide (MICROZIDE) 12.5 MG capsule Take 12.5 mg by mouth daily.  Marland Kitchen LORazepam (ATIVAN) 0.5 MG tablet Take 0.5 mg by mouth every 8 (eight) hours.  . Multiple Vitamin (MULTIVITAMIN WITH MINERALS) TABS tablet Take 1 tablet by mouth daily.  Marland Kitchen omeprazole (PRILOSEC) 20 MG capsule Take 20 mg by mouth daily.  . polyethylene glycol (MIRALAX / GLYCOLAX) packet Take 17 g by mouth as directed.  . progesterone (PROMETRIUM) 100 MG capsule Take 100 mg by mouth daily.  . valsartan (DIOVAN) 320 MG tablet Take 320 mg by mouth daily.     Allergies:   Penicillin g; Penicillins; Amlodipine besylate; Betamethasone; Codeine; Other; Sulfa antibiotics; and Sulfamethoxazole   Past Medical History:  Diagnosis Date  . Anxiety   . GERD (gastroesophageal reflux disease)   . Hypertension     Past Surgical History:  Procedure Laterality Date  . ELBOW ARTHROSCOPY Right 05/03/2016   Procedure: RIGHT ARTHROSCOPY ELBOW WITH REMOVAL FOREIGN BODIES;  Surgeon: Leanora Cover, MD;  Location: [place];  Service: Orthopedics;  Laterality: Right;  . TONSILLECTOMY       Social History:  The patient  reports that she has been smoking.  She has been smoking about 0.50 packs per day. She has never used smokeless tobacco. She reports that she drinks alcohol.   Family History:  The patient's family history includes Diabetes in her sister; Heart attack (age of onset: 65) in her sister; Heart attack (age of onset: 33) in her maternal grandmother; Heart attack (age of onset: 60) in her brother; Heart attack (age of onset: 73) in her father; Heart disease in her father.    ROS:  Please see the history of present illness. All other systems are reviewed and  Negative to the above problem except as noted.    PHYSICAL EXAM: VS:  BP 130/88   Pulse 77   Ht 5\' 3"  (1.6 m)   Wt 164 lb 6.4 oz (74.6 kg)   BMI 29.12 kg/m   GEN: Well nourished, well developed, in no acute distress  HEENT: normal  Neck: no JVD, carotid bruits, or masses Cardiac: RRR; no murmurs, rubs, or gallops,no edema  Respiratory:  SOme decreased airflow  with forced expiration with mild wheezing   GI: soft, nontender, nondistended, + BS  No hepatomegaly  MS: no deformity Moving all extremities   Skin: warm and dry, no rash Neuro:  Strength and sensation are intact Psych: euthymic mood, full affect   EKG:  EKG is ordered today.  SR 77 bpm  Nonspecific ST changes     Lipid Panel No results found for: CHOL, TRIG, HDL, CHOLHDL, VLDL, LDLCALC, LDLDIRECT    Wt Readings from Last 3 Encounters:  09/29/16 164 lb 6.4 oz (74.6 kg)  05/03/16 162 lb 3.2 oz (73.6 kg)      ASSESSMENT AND PLAN:  1  SOB/CHest tightness  Pt with about a 1=2 month history of SOB and chest tightness with exertion  Is recovering from a URI  Exam with some decreased airflow and wheeze  Concerning is her VERY STRONG Fhx of CAD    Discussed options for eval with pt  She would prefer initial noninvasive eval  WOuld sched GXT Myovue to eval Would als set up for PFTs   Further eval based on results  Low threshold for mor eval given risk of false negative   Start 81 mg ASA for now  Activity as toelrated  2.  HTN  Adquate control  3  Tob  Counselled on cessation    Get lipids from Dr [facility] office  F/U based on testreuslts   Current medicines are reviewed at length with the patient today.  The patient does not have concerns regarding medicines.    ADDENDUM:  10/06/16.   Pt underwent GXT/Myovue today  Walked about 3:30  Developed CP an some mild EKG changes  Test stopped  Symptoms eased with NTG.  Myovue showed anterior ischemia  Note pt had mild chest discomfort coming from parking lot. I discussed L heart cath with possible angioplasty. Risks/benefits described  Pt understands and agrees to proceed.   Dorris Carnes  Signed, Dorris Carnes, MD  09/29/2016 11:10 AM    [address], [place], [place]  [address] Phone: [phone]; Fax: [phone]

## 2016-09-29 ENCOUNTER — Ambulatory Visit (INDEPENDENT_AMBULATORY_CARE_PROVIDER_SITE_OTHER): Payer: Managed Care, Other (non HMO) | Admitting: Internal Medicine

## 2016-09-29 ENCOUNTER — Encounter: Payer: Self-pay | Admitting: Internal Medicine

## 2016-09-29 ENCOUNTER — Encounter (INDEPENDENT_AMBULATORY_CARE_PROVIDER_SITE_OTHER): Payer: Self-pay

## 2016-09-29 ENCOUNTER — Telehealth (HOSPITAL_COMMUNITY): Payer: Self-pay | Admitting: *Deleted

## 2016-09-29 VITALS — BP 130/88 | HR 77 | Ht 63.0 in | Wt 164.4 lb

## 2016-09-29 DIAGNOSIS — R0602 Shortness of breath: Secondary | ICD-10-CM | POA: Diagnosis not present

## 2016-09-29 DIAGNOSIS — R0789 Other chest pain: Secondary | ICD-10-CM

## 2016-09-29 MED ORDER — ASPIRIN EC 81 MG PO TBEC: 81.0000 mg | DELAYED_RELEASE_TABLET | Freq: Every day | ORAL | 3 refills | Status: AC

## 2016-09-29 NOTE — Telephone Encounter (Signed)
Patient given detailed instructions per Myocardial Perfusion Study Information Sheet for the test on 10/03/16 at 0730. Patient notified to arrive 15 minutes early and that it is imperative to arrive on time for appointment to keep from having the test rescheduled.  If you need to cancel or reschedule your appointment, please call the office within 24 hours of your appointment. Failure to do so may result in a cancellation of your appointment, and a $50 no show fee. Patient verbalized understanding.Oval Cavazos, Ranae [place]ms

## 2016-09-29 NOTE — Patient Instructions (Signed)
Your physician has recommended you make the following change in your medication:  1.) aspirin 81 mg EC (enteric coated) one tablet once daily  Your physician has requested that you have en exercise stress myoview. For further information please visit [email or web address]. Please follow instruction sheet, as given.  Your physician has recommended that you have a pulmonary function test. Pulmonary Function Tests are a group of tests that measure how well air moves in and out of your lungs.  Follow up with your physician will depend on test results.

## 2016-10-03 ENCOUNTER — Encounter (HOSPITAL_COMMUNITY): Payer: Managed Care, Other (non HMO)

## 2016-10-03 ENCOUNTER — Telehealth (HOSPITAL_COMMUNITY): Payer: Self-pay | Admitting: *Deleted

## 2016-10-03 NOTE — Telephone Encounter (Signed)
Patient given detailed instructions per Myocardial Perfusion Study Information Sheet for the test on 10/05/16 at 1245. Patient notified to arrive 15 minutes early and that it is imperative to arrive on time for appointment to keep from having the test rescheduled.  If you need to cancel or reschedule your appointment, please call the office within 24 hours of your appointment. Failure to do so may result in a cancellation of your appointment, and a $50 no show fee. Patient verbalized understanding.Enrica Corliss, Ranae [place]ms

## 2016-10-04 ENCOUNTER — Telehealth (HOSPITAL_COMMUNITY): Payer: Self-pay | Admitting: *Deleted

## 2016-10-04 NOTE — Telephone Encounter (Signed)
Patient given detailed instructions per Myocardial Perfusion Study Information Sheet for the test on 10/06/16 at 0730. Patient notified to arrive 15 minutes early and that it is imperative to arrive on time for appointment to keep from having the test rescheduled.  If you need to cancel or reschedule your appointment, please call the office within 24 hours of your appointment. Failure to do so may result in a cancellation of your appointment, and a $50 no show fee. Patient verbalized understanding.Damien Cisar, Ranae [place]ms

## 2016-10-06 ENCOUNTER — Ambulatory Visit (HOSPITAL_COMMUNITY)
Admission: RE | Admit: 2016-10-06 | Discharge: 2016-10-07 | Disposition: A | Discharge status: Home / Self Care | Payer: Managed Care, Other (non HMO) | Source: Ambulatory Visit | Attending: Cardiovascular Disease | Admitting: Cardiovascular Disease

## 2016-10-06 ENCOUNTER — Ambulatory Visit (HOSPITAL_BASED_OUTPATIENT_CLINIC_OR_DEPARTMENT_OTHER): Payer: Managed Care, Other (non HMO)

## 2016-10-06 ENCOUNTER — Other Ambulatory Visit: Payer: Self-pay

## 2016-10-06 ENCOUNTER — Encounter (HOSPITAL_COMMUNITY)
Admission: RE | Disposition: A | Discharge status: Home / Self Care | Payer: Self-pay | Source: Ambulatory Visit | Attending: Cardiovascular Disease

## 2016-10-06 ENCOUNTER — Encounter (HOSPITAL_COMMUNITY): Payer: Self-pay | Admitting: General Practice

## 2016-10-06 DIAGNOSIS — R9439 Abnormal result of other cardiovascular function study: Secondary | ICD-10-CM | POA: Diagnosis present

## 2016-10-06 DIAGNOSIS — R0602 Shortness of breath: Secondary | ICD-10-CM

## 2016-10-06 DIAGNOSIS — Z8249 Family history of ischemic heart disease and other diseases of the circulatory system: Secondary | ICD-10-CM | POA: Diagnosis not present

## 2016-10-06 DIAGNOSIS — I25110 Atherosclerotic heart disease of native coronary artery with unstable angina pectoris: Secondary | ICD-10-CM

## 2016-10-06 DIAGNOSIS — Z79899 Other long term (current) drug therapy: Secondary | ICD-10-CM | POA: Insufficient documentation

## 2016-10-06 DIAGNOSIS — R0789 Other chest pain: Secondary | ICD-10-CM | POA: Diagnosis not present

## 2016-10-06 DIAGNOSIS — J069 Acute upper respiratory infection, unspecified: Secondary | ICD-10-CM | POA: Insufficient documentation

## 2016-10-06 DIAGNOSIS — Z833 Family history of diabetes mellitus: Secondary | ICD-10-CM | POA: Insufficient documentation

## 2016-10-06 DIAGNOSIS — F419 Anxiety disorder, unspecified: Secondary | ICD-10-CM | POA: Diagnosis not present

## 2016-10-06 DIAGNOSIS — K219 Gastro-esophageal reflux disease without esophagitis: Secondary | ICD-10-CM | POA: Diagnosis not present

## 2016-10-06 DIAGNOSIS — I200 Unstable angina: Secondary | ICD-10-CM | POA: Diagnosis present

## 2016-10-06 DIAGNOSIS — I10 Essential (primary) hypertension: Secondary | ICD-10-CM | POA: Diagnosis present

## 2016-10-06 DIAGNOSIS — Z95820 Peripheral vascular angioplasty status with implants and grafts: Secondary | ICD-10-CM

## 2016-10-06 DIAGNOSIS — I2510 Atherosclerotic heart disease of native coronary artery without angina pectoris: Secondary | ICD-10-CM | POA: Diagnosis present

## 2016-10-06 DIAGNOSIS — Z7902 Long term (current) use of antithrombotics/antiplatelets: Secondary | ICD-10-CM | POA: Diagnosis not present

## 2016-10-06 DIAGNOSIS — Z880 Allergy status to penicillin: Secondary | ICD-10-CM | POA: Diagnosis not present

## 2016-10-06 DIAGNOSIS — Z882 Allergy status to sulfonamides status: Secondary | ICD-10-CM | POA: Diagnosis not present

## 2016-10-06 DIAGNOSIS — Z885 Allergy status to narcotic agent status: Secondary | ICD-10-CM | POA: Diagnosis not present

## 2016-10-06 DIAGNOSIS — Z888 Allergy status to other drugs, medicaments and biological substances status: Secondary | ICD-10-CM | POA: Insufficient documentation

## 2016-10-06 DIAGNOSIS — E785 Hyperlipidemia, unspecified: Secondary | ICD-10-CM | POA: Diagnosis not present

## 2016-10-06 DIAGNOSIS — F17200 Nicotine dependence, unspecified, uncomplicated: Secondary | ICD-10-CM | POA: Diagnosis not present

## 2016-10-06 DIAGNOSIS — Z955 Presence of coronary angioplasty implant and graft: Secondary | ICD-10-CM

## 2016-10-06 HISTORY — DX: Pneumonia, unspecified organism: J18.9

## 2016-10-06 HISTORY — DX: Hyperlipidemia, unspecified: E78.5

## 2016-10-06 HISTORY — DX: Peripheral vascular angioplasty status with implants and grafts: Z95.820

## 2016-10-06 HISTORY — DX: Essential (primary) hypertension: I10

## 2016-10-06 HISTORY — DX: Unspecified chronic bronchitis: J42

## 2016-10-06 HISTORY — PX: CORONARY ANGIOPLASTY WITH STENT PLACEMENT: SHX49

## 2016-10-06 HISTORY — PX: CORONARY STENT INTERVENTION: CATH118234

## 2016-10-06 HISTORY — PX: LEFT HEART CATH AND CORONARY ANGIOGRAPHY: CATH118249

## 2016-10-06 HISTORY — DX: Atherosclerotic heart disease of native coronary artery without angina pectoris: I25.10

## 2016-10-06 HISTORY — DX: Abnormal result of other cardiovascular function study: R94.39

## 2016-10-06 LAB — BASIC METABOLIC PANEL
Anion gap: 10 (ref 5–15)
BUN: 6 mg/dL (ref 6–20)
CO2: 27 mmol/L (ref 22–32)
Calcium: 9.2 mg/dL (ref 8.9–10.3)
Chloride: 101 mmol/L (ref 101–111)
Creatinine, Ser: 0.67 mg/dL (ref 0.44–1.00)
GFR calc Af Amer: >60 mL/min (ref >60)
GFR calc non Af Amer: >60 mL/min (ref >60)
Glucose, Bld: 86 mg/dL (ref 65–99)
Potassium: 3.6 mmol/L (ref 3.5–5.1)
Sodium: 138 mmol/L (ref 135–145)

## 2016-10-06 LAB — MYOCARDIAL PERFUSION IMAGING
Estimated workload: 4.6 METS
Exercise duration (min): 3 min
Exercise duration (sec): 39 s
LV dias vol: 64 mL (ref 46–106)
LV sys vol: 24 mL
MPHR: 164 {beats}/min
Peak HR: 126 {beats}/min
Percent HR: 77 %
RATE: 0.34
Rest HR: 72 {beats}/min
SDS: 3
SRS: 7
SSS: 8
TID: 0.99

## 2016-10-06 LAB — CBC
HCT: 40.7 % (ref 36.0–46.0)
Hemoglobin: 14.1 g/dL (ref 12.0–15.0)
MCH: 33.4 pg (ref 26.0–34.0)
MCHC: 34.6 g/dL (ref 30.0–36.0)
MCV: 96.4 fL (ref 78.0–100.0)
Platelets: 129 10*3/uL — ABNORMAL LOW (ref 150–400)
RBC: 4.22 MIL/uL (ref 3.87–5.11)
RDW: 12.8 % (ref 11.5–15.5)
WBC: 3.9 10*3/uL — ABNORMAL LOW (ref 4.0–10.5)

## 2016-10-06 LAB — PROTIME-INR
INR: 1.06
Prothrombin Time: 13.8 seconds (ref 11.4–15.2)

## 2016-10-06 LAB — POCT ACTIVATED CLOTTING TIME: Activated Clotting Time: 390 seconds

## 2016-10-06 SURGERY — LEFT HEART CATH AND CORONARY ANGIOGRAPHY
Anesthesia: LOCAL

## 2016-10-06 MED ORDER — LIDOCAINE HCL (PF) 1 % IJ SOLN
INTRAMUSCULAR | Status: AC
  Filled 2016-10-06: qty 30

## 2016-10-06 MED ORDER — FENTANYL CITRATE (PF) 100 MCG/2ML IJ SOLN
INTRAMUSCULAR | Status: AC
  Filled 2016-10-06: qty 2

## 2016-10-06 MED ORDER — TICAGRELOR 90 MG PO TABS
ORAL_TABLET | ORAL | Status: AC
  Filled 2016-10-06: qty 2

## 2016-10-06 MED ORDER — PANTOPRAZOLE SODIUM 40 MG PO TBEC
40.0000 mg | DELAYED_RELEASE_TABLET | Freq: Every day | ORAL | Status: DC
  Administered 2016-10-06: 17:00:00 40 mg via ORAL
  Filled 2016-10-06: qty 1

## 2016-10-06 MED ORDER — VERAPAMIL HCL 2.5 MG/ML IV SOLN
INTRAVENOUS | Status: DC | PRN
  Administered 2016-10-06: 10 mL via INTRA_ARTERIAL

## 2016-10-06 MED ORDER — SODIUM CHLORIDE 0.9 % IV SOLN
INTRAVENOUS | Status: DC
  Administered 2016-10-06: 12:00:00 via INTRAVENOUS

## 2016-10-06 MED ORDER — TECHNETIUM TC 99M TETROFOSMIN IV KIT
33.0000 | PACK | Freq: Once | INTRAVENOUS | Status: AC | PRN
Start: 2016-10-06 — End: 2016-10-06
  Administered 2016-10-06: 33 via INTRAVENOUS
  Filled 2016-10-06: qty 33

## 2016-10-06 MED ORDER — MIDAZOLAM HCL 2 MG/2ML IJ SOLN
INTRAMUSCULAR | Status: DC | PRN
  Administered 2016-10-06: 2 mg via INTRAVENOUS
  Administered 2016-10-06: 1 mg via INTRAVENOUS

## 2016-10-06 MED ORDER — NITROGLYCERIN 1 MG/10 ML FOR IR/CATH LAB
INTRA_ARTERIAL | Status: AC
  Filled 2016-10-06: qty 10

## 2016-10-06 MED ORDER — POLYETHYLENE GLYCOL 3350 17 G PO PACK
17.0000 g | PACK | Freq: Every day | ORAL | Status: DC
  Administered 2016-10-07: 11:00:00 17 g via ORAL
  Filled 2016-10-06: qty 1

## 2016-10-06 MED ORDER — HYDRALAZINE HCL 20 MG/ML IJ SOLN: 5.0000 mg | INTRAMUSCULAR | Status: AC | PRN

## 2016-10-06 MED ORDER — MIDAZOLAM HCL 2 MG/2ML IJ SOLN
INTRAMUSCULAR | Status: AC
  Filled 2016-10-06: qty 2

## 2016-10-06 MED ORDER — IOPAMIDOL (ISOVUE-370) INJECTION 76%
INTRAVENOUS | Status: AC
  Filled 2016-10-06: qty 100

## 2016-10-06 MED ORDER — FENTANYL CITRATE (PF) 100 MCG/2ML IJ SOLN
INTRAMUSCULAR | Status: DC | PRN
  Administered 2016-10-06: 25 ug via INTRAVENOUS
  Administered 2016-10-06 (×2): 50 ug via INTRAVENOUS

## 2016-10-06 MED ORDER — VERAPAMIL HCL 2.5 MG/ML IV SOLN
INTRAVENOUS | Status: AC
  Filled 2016-10-06: qty 2

## 2016-10-06 MED ORDER — SODIUM CHLORIDE 0.9 % IV SOLN: 250.0000 mL | INTRAVENOUS | Status: DC | PRN

## 2016-10-06 MED ORDER — ATORVASTATIN CALCIUM 80 MG PO TABS
80.0000 mg | ORAL_TABLET | Freq: Every day | ORAL | Status: DC
  Administered 2016-10-06: 80 mg via ORAL
  Filled 2016-10-06: qty 1

## 2016-10-06 MED ORDER — SODIUM CHLORIDE 0.9 % IV SOLN: INTRAVENOUS | Status: AC

## 2016-10-06 MED ORDER — AMLODIPINE BESYLATE 5 MG PO TABS
2.5000 mg | ORAL_TABLET | Freq: Every day | ORAL | Status: DC
  Administered 2016-10-06 – 2016-10-07 (×2): 2.5 mg via ORAL
  Filled 2016-10-06 (×2): qty 1

## 2016-10-06 MED ORDER — TICAGRELOR 90 MG PO TABS
90.0000 mg | ORAL_TABLET | Freq: Two times a day (BID) | ORAL | Status: DC
  Administered 2016-10-07 (×2): 90 mg via ORAL
  Filled 2016-10-06 (×2): qty 1

## 2016-10-06 MED ORDER — SODIUM CHLORIDE 0.9% FLUSH: 3.0000 mL | INTRAVENOUS | Status: DC | PRN

## 2016-10-06 MED ORDER — TICAGRELOR 90 MG PO TABS
ORAL_TABLET | ORAL | Status: DC | PRN
  Administered 2016-10-06: 180 mg via ORAL

## 2016-10-06 MED ORDER — ASPIRIN 81 MG PO CHEW
CHEWABLE_TABLET | ORAL | Status: AC
  Administered 2016-10-06: 81 mg
  Filled 2016-10-06: qty 1

## 2016-10-06 MED ORDER — HEPARIN SODIUM (PORCINE) 1000 UNIT/ML IJ SOLN
INTRAMUSCULAR | Status: AC
  Filled 2016-10-06: qty 1

## 2016-10-06 MED ORDER — LORAZEPAM 0.5 MG PO TABS: 0.5000 mg | ORAL_TABLET | Freq: Every day | ORAL | Status: DC | PRN

## 2016-10-06 MED ORDER — SODIUM CHLORIDE 0.9% FLUSH: 3.0000 mL | Freq: Two times a day (BID) | INTRAVENOUS | Status: DC

## 2016-10-06 MED ORDER — SODIUM CHLORIDE 0.9% FLUSH
3.0000 mL | Freq: Two times a day (BID) | INTRAVENOUS | Status: DC
  Administered 2016-10-06: 3 mL via INTRAVENOUS

## 2016-10-06 MED ORDER — TICAGRELOR 90 MG PO TABS: 90.0000 mg | ORAL_TABLET | Freq: Two times a day (BID) | ORAL | Status: DC

## 2016-10-06 MED ORDER — PROGESTERONE MICRONIZED 100 MG PO CAPS: 100.0000 mg | ORAL_CAPSULE | Freq: Every day | ORAL | Status: DC

## 2016-10-06 MED ORDER — TECHNETIUM TC 99M TETROFOSMIN IV KIT
10.2000 | PACK | Freq: Once | INTRAVENOUS | Status: AC | PRN
  Administered 2016-10-06: 10.2 via INTRAVENOUS
  Filled 2016-10-06: qty 11

## 2016-10-06 MED ORDER — IOPAMIDOL (ISOVUE-370) INJECTION 76%
INTRAVENOUS | Status: AC
  Filled 2016-10-06: qty 50

## 2016-10-06 MED ORDER — IOPAMIDOL (ISOVUE-370) INJECTION 76%
INTRAVENOUS | Status: DC | PRN
  Administered 2016-10-06: 150 mL via INTRA_ARTERIAL

## 2016-10-06 MED ORDER — CARVEDILOL 12.5 MG PO TABS
25.0000 mg | ORAL_TABLET | Freq: Two times a day (BID) | ORAL | Status: DC
  Administered 2016-10-06 – 2016-10-07 (×2): 25 mg via ORAL
  Filled 2016-10-06 (×3): qty 2

## 2016-10-06 MED ORDER — ASPIRIN EC 81 MG PO TBEC
81.0000 mg | DELAYED_RELEASE_TABLET | Freq: Every day | ORAL | Status: DC
  Administered 2016-10-07: 11:00:00 81 mg via ORAL
  Filled 2016-10-06: qty 1

## 2016-10-06 MED ORDER — ACETAMINOPHEN 325 MG PO TABS: 650.0000 mg | ORAL_TABLET | ORAL | Status: DC | PRN

## 2016-10-06 MED ORDER — ASPIRIN 81 MG PO CHEW: 81.0000 mg | CHEWABLE_TABLET | ORAL | Status: DC

## 2016-10-06 MED ORDER — HEPARIN (PORCINE) IN NACL 2-0.9 UNIT/ML-% IJ SOLN
INTRAMUSCULAR | Status: AC
  Filled 2016-10-06: qty 1500

## 2016-10-06 MED ORDER — LABETALOL HCL 5 MG/ML IV SOLN: 10.0000 mg | INTRAVENOUS | Status: AC | PRN

## 2016-10-06 MED ORDER — NITROGLYCERIN 1 MG/10 ML FOR IR/CATH LAB
INTRA_ARTERIAL | Status: DC | PRN
  Administered 2016-10-06: 200 ug via INTRACORONARY

## 2016-10-06 MED ORDER — HEPARIN SODIUM (PORCINE) 1000 UNIT/ML IJ SOLN
INTRAMUSCULAR | Status: DC | PRN
  Administered 2016-10-06: 6000 [IU] via INTRAVENOUS
  Administered 2016-10-06: 4000 [IU] via INTRAVENOUS

## 2016-10-06 MED ORDER — ONDANSETRON HCL 4 MG/2ML IJ SOLN: 4.0000 mg | Freq: Four times a day (QID) | INTRAMUSCULAR | Status: DC | PRN

## 2016-10-06 MED ORDER — HEPARIN (PORCINE) IN NACL 2-0.9 UNIT/ML-% IJ SOLN
INTRAMUSCULAR | Status: DC | PRN
  Administered 2016-10-06: 1500 mL

## 2016-10-06 MED ORDER — IRBESARTAN 75 MG PO TABS
150.0000 mg | ORAL_TABLET | Freq: Every day | ORAL | Status: DC
Start: 2016-10-07 — End: 2016-10-07
  Administered 2016-10-07: 150 mg via ORAL
  Filled 2016-10-06: qty 2

## 2016-10-06 SURGICAL SUPPLY — 21 items
BALLN MOZEC 2.0X12 (BALLOONS) ×2
BALLN ~~LOC~~ TREK RX 3.0X12 (BALLOONS) ×2
BALLOON MOZEC 2.0X12 (BALLOONS) ×1 IMPLANT
BALLOON ~~LOC~~ TREK RX 3.0X12 (BALLOONS) ×1 IMPLANT
CATH EXPO 5F FL3.5 (CATHETERS) ×2 IMPLANT
CATH EXPO 5FR ANG PIGTAIL 145 (CATHETERS) ×2 IMPLANT
CATH INFINITI JR4 5F (CATHETERS) ×2 IMPLANT
CATH VISTA GUIDE 6FR XBLAD3.5 (CATHETERS) ×2 IMPLANT
DEVICE RAD COMP TR BAND LRG (VASCULAR PRODUCTS) ×2 IMPLANT
GLIDESHEATH SLEND SS 6F .021 (SHEATH) ×2 IMPLANT
GUIDEWIRE INQWIRE 1.5J.035X260 (WIRE) ×1 IMPLANT
INQWIRE 1.5J .035X260CM (WIRE) ×2
KIT ENCORE 26 ADVANTAGE (KITS) ×2 IMPLANT
KIT HEART LEFT (KITS) ×2 IMPLANT
PACK CARDIAC CATHETERIZATION (CUSTOM PROCEDURE TRAY) ×2 IMPLANT
STENT RESOLUTE ONYX 3.0X15 (Permanent Stent) ×2 IMPLANT
SYR MEDRAD MARK V 150ML (SYRINGE) ×2 IMPLANT
TRANSDUCER W/STOPCOCK (MISCELLANEOUS) ×2 IMPLANT
TUBING CIL FLEX 10 FLL-RA (TUBING) ×2 IMPLANT
WIRE COUGAR XT STRL 190CM (WIRE) ×2 IMPLANT
WIRE HI TORQ WHISPER MS 190CM (WIRE) ×2 IMPLANT

## 2016-10-06 NOTE — Interval H&P Note (Signed)
History and Physical Interval Note:  10/06/2016 2:00 PM  Rebecca Dalton  has presented today for cardiac cath with the diagnosis of abnormal stress test, unstable angina.  The various methods of treatment have been discussed with the patient and family. After consideration of risks, benefits and other options for treatment, the patient has consented to  Procedure(s): Left Heart Cath and Coronary Angiography (N/A) as a surgical intervention .  The patient's history has been reviewed, patient examined, no change in status, stable for surgery.  I have reviewed the patient's chart and labs.  Questions were answered to the patient's satisfaction.    Cath Lab Visit (complete for each Cath Lab visit)  Clinical Evaluation Leading to the Procedure:   ACS: No.  Non-ACS:    Anginal Classification: CCS III  Anti-ischemic medical therapy: Maximal Therapy (2 or more classes of medications)  Non-Invasive Test Results: High risk stress test  Prior CABG: No previous CABG         Rebecca Dalton

## 2016-10-06 NOTE — H&P (View-Only) (Signed)
Cardiology Office Note   Date:  09/29/2016   ID:  Rebecca Dalton, DOB [birth year], MRN [id number]  PCP:  Vidal Schwalbe, MD  Cardiologist:   Dorris Carnes, MD    Pt Self referred for chest tightness, sob     History of Present Illness: Rebecca Dalton is a 56 y.o. female with a history of HTN, N/  Seen in internal medicine  BP was 120/80  Pt C/O SOB Had resp problem in Dec/Jan  Then weather got cold  Hurt to breathe  Then when warmer  Went and walked  Had to stop and get breath  Something squeezing with jaw pani  SInce then hasnt get anythnig length    Coming in from parking lot had chest tightness  Hurts to breathe  Cant breathe   NO wheezing excpet when sick    Note pt had nuclear stress test over 12 years ago Ordered by Linard Millers    Current Meds  Medication Sig  . amLODipine (NORVASC) 2.5 MG tablet Take 2.5 mg by mouth daily.  . carvedilol (COREG) 25 MG tablet Take 25 mg by mouth 2 (two) times daily with a meal.  . cyclobenzaprine (FLEXERIL) 10 MG tablet TAKE 1/2 TO 1 TABLET 3 TIMES A DAY AS NEEDED 10  . hydrochlorothiazide (MICROZIDE) 12.5 MG capsule Take 12.5 mg by mouth daily.  Marland Kitchen LORazepam (ATIVAN) 0.5 MG tablet Take 0.5 mg by mouth every 8 (eight) hours.  . Multiple Vitamin (MULTIVITAMIN WITH MINERALS) TABS tablet Take 1 tablet by mouth daily.  Marland Kitchen omeprazole (PRILOSEC) 20 MG capsule Take 20 mg by mouth daily.  . polyethylene glycol (MIRALAX / GLYCOLAX) packet Take 17 g by mouth as directed.  . progesterone (PROMETRIUM) 100 MG capsule Take 100 mg by mouth daily.  . valsartan (DIOVAN) 320 MG tablet Take 320 mg by mouth daily.     Allergies:   Penicillin g; Penicillins; Amlodipine besylate; Betamethasone; Codeine; Other; Sulfa antibiotics; and Sulfamethoxazole   Past Medical History:  Diagnosis Date  . Anxiety   . GERD (gastroesophageal reflux disease)   . Hypertension     Past Surgical History:  Procedure Laterality Date  . ELBOW ARTHROSCOPY Right 05/03/2016   Procedure: RIGHT ARTHROSCOPY ELBOW WITH REMOVAL FOREIGN BODIES;  Surgeon: Leanora Cover, MD;  Location: [facility];  Service: Orthopedics;  Laterality: Right;  . TONSILLECTOMY       Social History:  The patient  reports that she has been smoking.  She has been smoking about 0.50 packs per day. She has never used smokeless tobacco. She reports that she drinks alcohol.   Family History:  The patient's family history includes Diabetes in her sister; Heart attack (age of onset: 39) in her sister; Heart attack (age of onset: 10) in her maternal grandmother; Heart attack (age of onset: 42) in her brother; Heart attack (age of onset: 31) in her father; Heart disease in her father.    ROS:  Please see the history of present illness. All other systems are reviewed and  Negative to the above problem except as noted.    PHYSICAL EXAM: VS:  BP 130/88   Pulse 77   Ht 5\' 3"  (1.6 m)   Wt 164 lb 6.4 oz (74.6 kg)   BMI 29.12 kg/m   GEN: Well nourished, well developed, in no acute distress  HEENT: normal  Neck: no JVD, carotid bruits, or masses Cardiac: RRR; no murmurs, rubs, or gallops,no edema  Respiratory:  SOme decreased airflow  with forced expiration with mild wheezing   GI: soft, nontender, nondistended, + BS  No hepatomegaly  MS: no deformity Moving all extremities   Skin: warm and dry, no rash Neuro:  Strength and sensation are intact Psych: euthymic mood, full affect   EKG:  EKG is ordered today.  SR 77 bpm  Nonspecific ST changes     Lipid Panel No results found for: CHOL, TRIG, HDL, CHOLHDL, VLDL, LDLCALC, LDLDIRECT    Wt Readings from Last 3 Encounters:  09/29/16 164 lb 6.4 oz (74.6 kg)  05/03/16 162 lb 3.2 oz (73.6 kg)      ASSESSMENT AND PLAN:  1  SOB/CHest tightness  Pt with about a 1=2 month history of SOB and chest tightness with exertion  Is recovering from a URI  Exam with some decreased airflow and wheeze  Concerning is her VERY STRONG Fhx of CAD    Discussed options for eval with pt  She would prefer initial noninvasive eval  WOuld sched GXT Myovue to eval Would als set up for PFTs   Further eval based on results  Low threshold for mor eval given risk of false negative   Start 81 mg ASA for now  Activity as toelrated  2.  HTN  Adquate control  3  Tob  Counselled on cessation    Get lipids from Dr [facility] office  F/U based on testreuslts   Current medicines are reviewed at length with the patient today.  The patient does not have concerns regarding medicines.    ADDENDUM:  10/06/16.   Pt underwent GXT/Myovue today  Walked about 3:30  Developed CP an some mild EKG changes  Test stopped  Symptoms eased with NTG.  Myovue showed anterior ischemia  Note pt had mild chest discomfort coming from parking lot. I discussed L heart cath with possible angioplasty. Risks/benefits described  Pt understands and agrees to proceed.   Dorris Carnes  Signed, Dorris Carnes, MD  09/29/2016 11:10 AM    [address], [place], [place]  [address] Phone: [phone]; Fax: [phone]

## 2016-10-06 NOTE — Progress Notes (Signed)
TR BAND REMOVAL  LOCATION:    right radial  DEFLATED PER PROTOCOL:    Yes.    TIME BAND OFF / DRESSING APPLIED:    2000   SITE UPON ARRIVAL:    Level 0  SITE AFTER BAND REMOVAL:    Level 1(bruised)  CIRCULATION SENSATION AND MOVEMENT:    Within Normal Limits   Yes.    COMMENTS:   Tolerated procedure well

## 2016-10-06 NOTE — Care Management Note (Signed)
Case Management Note  Patient Details  Name: Rebecca Dalton MRN: [id number] Date of Birth: 01-10-1961  Subjective/Objective:    S/p coronary stent, will be on brilinta, NCM awaiting benefit check.                Action/Plan:   Expected Discharge Date:                  Expected Discharge Plan:  Home/Self Care  In-House Referral:     Discharge planning Services  CM Consult  Post Acute Care Choice:    Choice offered to:     DME Arranged:    DME Agency:     HH Arranged:    HH Agency:     Status of Service:  In process, will continue to follow  If discussed at Long Length of Stay Meetings, dates discussed:    Additional Comments:  Zenon Mayo, RN 10/06/2016, 5:02 PM

## 2016-10-07 ENCOUNTER — Encounter (HOSPITAL_COMMUNITY): Payer: Self-pay | Admitting: Cardiovascular Disease

## 2016-10-07 DIAGNOSIS — I200 Unstable angina: Secondary | ICD-10-CM | POA: Diagnosis not present

## 2016-10-07 DIAGNOSIS — I10 Essential (primary) hypertension: Secondary | ICD-10-CM

## 2016-10-07 DIAGNOSIS — I2510 Atherosclerotic heart disease of native coronary artery without angina pectoris: Secondary | ICD-10-CM | POA: Diagnosis not present

## 2016-10-07 DIAGNOSIS — E782 Mixed hyperlipidemia: Secondary | ICD-10-CM | POA: Diagnosis not present

## 2016-10-07 DIAGNOSIS — Z959 Presence of cardiac and vascular implant and graft, unspecified: Secondary | ICD-10-CM

## 2016-10-07 DIAGNOSIS — R9439 Abnormal result of other cardiovascular function study: Secondary | ICD-10-CM | POA: Diagnosis not present

## 2016-10-07 DIAGNOSIS — E785 Hyperlipidemia, unspecified: Secondary | ICD-10-CM

## 2016-10-07 DIAGNOSIS — I25110 Atherosclerotic heart disease of native coronary artery with unstable angina pectoris: Secondary | ICD-10-CM | POA: Diagnosis not present

## 2016-10-07 DIAGNOSIS — Z95820 Peripheral vascular angioplasty status with implants and grafts: Secondary | ICD-10-CM

## 2016-10-07 HISTORY — DX: Peripheral vascular angioplasty status with implants and grafts: Z95.820

## 2016-10-07 HISTORY — DX: Abnormal result of other cardiovascular function study: R94.39

## 2016-10-07 HISTORY — DX: Hyperlipidemia, unspecified: E78.5

## 2016-10-07 HISTORY — DX: Atherosclerotic heart disease of native coronary artery without angina pectoris: I25.10

## 2016-10-07 HISTORY — DX: Essential (primary) hypertension: I10

## 2016-10-07 LAB — BASIC METABOLIC PANEL
Anion gap: 10 (ref 5–15)
BUN: 7 mg/dL (ref 6–20)
CO2: 28 mmol/L (ref 22–32)
Calcium: 9.3 mg/dL (ref 8.9–10.3)
Chloride: 102 mmol/L (ref 101–111)
Creatinine, Ser: 0.77 mg/dL (ref 0.44–1.00)
GFR calc Af Amer: >60 mL/min (ref >60)
GFR calc non Af Amer: >60 mL/min (ref >60)
Glucose, Bld: 118 mg/dL — ABNORMAL HIGH (ref 65–99)
Potassium: 4 mmol/L (ref 3.5–5.1)
Sodium: 140 mmol/L (ref 135–145)

## 2016-10-07 LAB — CBC
HCT: 40 % (ref 36.0–46.0)
Hemoglobin: 13.8 g/dL (ref 12.0–15.0)
MCH: 33.3 pg (ref 26.0–34.0)
MCHC: 34.5 g/dL (ref 30.0–36.0)
MCV: 96.6 fL (ref 78.0–100.0)
Platelets: 125 10*3/uL — ABNORMAL LOW (ref 150–400)
RBC: 4.14 MIL/uL (ref 3.87–5.11)
RDW: 12.7 % (ref 11.5–15.5)
WBC: 4.7 10*3/uL (ref 4.0–10.5)

## 2016-10-07 MED ORDER — ACETAMINOPHEN 325 MG PO TABS: 650.0000 mg | ORAL_TABLET | ORAL | Status: DC | PRN

## 2016-10-07 MED ORDER — TICAGRELOR 90 MG PO TABS: 90.0000 mg | ORAL_TABLET | Freq: Two times a day (BID) | ORAL | 0 refills | Status: DC

## 2016-10-07 MED ORDER — ANGIOPLASTY BOOK
Freq: Once | Status: DC
  Filled 2016-10-07: qty 1

## 2016-10-07 MED ORDER — TICAGRELOR 90 MG PO TABS: 90.0000 mg | ORAL_TABLET | Freq: Two times a day (BID) | ORAL | 11 refills | Status: DC

## 2016-10-07 MED ORDER — ATORVASTATIN CALCIUM 80 MG PO TABS: 80.0000 mg | ORAL_TABLET | Freq: Every day | ORAL | 6 refills | Status: DC

## 2016-10-07 MED ORDER — NITROGLYCERIN 0.4 MG SL SUBL: 0.4000 mg | SUBLINGUAL_TABLET | SUBLINGUAL | 4 refills | Status: AC | PRN

## 2016-10-07 MED ORDER — NITROGLYCERIN 0.4 MG SL SUBL: 0.4000 mg | SUBLINGUAL_TABLET | SUBLINGUAL | Status: DC | PRN

## 2016-10-07 MED FILL — Lidocaine HCl Local Preservative Free (PF) Inj 1%: INTRAMUSCULAR | Qty: 30 | Status: AC

## 2016-10-07 NOTE — Progress Notes (Signed)
CARDIAC REHAB PHASE I   PRE:  Rate/Rhythm: 53 SR  BP:  Sitting: 142/106        SaO2: 99 RA  MODE:  Ambulation: 500 ft   POST:  Rate/Rhythm: 86 SR  BP:  Sitting: 165/92         SaO2: 99 RA  Pt ambulated 500 ft on RA, independent, steady gait, tolerated well with no complaints, BP somewhat elevated. Completed PCI/stent education with pt and significant other at bedside.  Reviewed risk factors, tobacco cessation (gave pt fake cigarette), PCI book, anti-platelet therapy, stent card, activity restrictions, ntg, exercise, heart healthy diet, and phase 2 cardiac rehab. Pt verbalized understanding, receptive to education. Pt agrees to phase 2 cardiac rehab referral, will send to [facility] per pt request. Pt to recliner after walk, call bell within reach.  [id number] Lenna Sciara, RN, BSN 10/07/2016 10:27 AM

## 2016-10-07 NOTE — Discharge Summary (Signed)
Discharge Summary    Patient ID: Rebecca Dalton,  MRN: [id number], DOB/AGE: January 29, 1961 56 y.o.  Admit date: 10/06/2016 Discharge date: 10/07/2016  Primary Care Provider: WHITE,CYNTHIA S Primary Cardiologist: Dr. Harrington Challenger  Discharge Diagnoses    Principal Problem:   Unstable angina [facility]) Active Problems:   S/P angioplasty with stent, 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/06/16   CAD in native artery   HLD (hyperlipidemia)   Abnormal cardiovascular stress test   HTN (hypertension)   Allergies Allergies  Allergen Reactions  . Penicillin G Shortness Of Breath  . Penicillins Shortness Of Breath  . Amlodipine Besylate Other (See Comments)    Unknown  . Betamethasone Other (See Comments)    Unknown  . Codeine Nausea Only  . Sulfa Antibiotics Hives  . Sulfamethoxazole Other (See Comments)    Unknown    Diagnostic Studies/Procedures    Cardiac cath 10/06/16  Dr. Angelena Form  Procedures   Coronary Stent Intervention  Left Heart Cath and Coronary Angiography  Conclusion     Mid RCA lesion, 20 %stenosed.  Mid RCA to Dist RCA lesion, 10 %stenosed.  Ost Cx to Prox Cx lesion, 20 %stenosed.  Ost Ramus to Ramus lesion, 20 %stenosed.  A STENT RESOLUTE ONYX 3.0X15 drug eluting stent was successfully placed.  Prox LAD to Mid LAD lesion, 99 %stenosed.  Post intervention, there is a 0% residual stenosis.  The left ventricular systolic function is normal.  LV end diastolic pressure is normal.  The left ventricular ejection fraction is greater than 65% by visual estimate.  There is no mitral valve regurgitation.   1. Unstable angina  2. Severe stenosis mid LAD 3. Mild non-obstructive disease in the RCA and Circumflex 4. Normal LV systolic function  123456 5. Successful PTCA/DES x 1 mid LAD  Recommendations: She will need DAPT with ASA and Brilinta for one year. I will start a statin. Continue beta blocker. Discharge in am.      _____________   History of Present Illness     60 yof  with hx of HTN, was seen in office for SOB and this progressed to associated squeezing chest pain.  She has very strong family hx of CAD.  Had nuc study to eval chest pain/tightness and study high risk with large defect of moderates severity in mid anteroseptal, apical septal and apex location.  Poor exercise tolerance.     + tobacco as well.  LDL 120, NHDL 146, Tchol 201.    Pt followed with Dr. Harrington Challenger and scheduled for cardiac cath.  Hospital Course     Consultants: none  Pt was sent for cardiac cath.  She underwent with no complications and had stent to mLAD, other vessels with minimal CAD.    Today she is SOB may be related to Brilinta.  She will ambulate with cardiac rehab and then re-evaluate if we should continue Brilinta.   She has been placed on high dose statin, will recheck lipids in 6-8 weeks.  She is on BB, PPI, ASA, Brilinta.  If her SOB increases we will change to plavix and with that load with 300 mg and then 75 mg daily.   _____________  Discharge Vitals Blood pressure (!) 165/92, pulse 70, temperature 98.1 F (36.7 C), temperature source Oral, resp. rate 19, height 5\' 2"  (1.575 m), weight 173 lb 11.6 oz (78.8 kg), SpO2 98 %.  Filed Weights   10/06/16 1556 10/07/16 0344  Weight: 164 lb 0.4 oz (74.4 kg) 173 lb 11.6 oz (78.8  kg)   General:Pleasant affect, NAD Skin:Warm and dry, brisk capillary refill HEENT:normocephalic, sclera clear, mucus membranes moist Neck:supple, no JVD Heart:S1S2 RRR without murmur, gallup, rub or click Lungs:clear without rales, rhonchi, or wheezes [id number], non tender, + BS, do not palpate liver spleen or masses Ext:no lower ext edema, 2+ pedal pulses, 2+ radial pulses Neuro:alert and oriented, MAE, follows commands, + facial symmetry  Labs & Radiologic Studies    CBC  Recent Labs  10/06/16 1222 10/07/16 0230  WBC 3.9* 4.7  HGB 14.1 13.8  HCT 40.7 40.0  MCV 96.4 96.6  PLT 129* 0000000*   Basic Metabolic Panel  Recent Labs   10/06/16 1222 10/07/16 0230  NA 138 140  K 3.6 4.0  CL 101 102  CO2 27 28  GLUCOSE 86 118*  BUN 6 7  CREATININE 0.67 0.77  CALCIUM 9.2 9.3    _____________  No results found. Disposition   Pt is being discharged home today in good condition.  Follow-up Plans & Appointments   Call [facility] at [phone] if any bleeding, swelling or drainage at cath site.  May shower, no tub baths for 48 hours for groin sticks. No lifting over 5 pounds for 3 days.  No Driving for 3 days  Take 1 NTG, under your tongue, while sitting.  If no relief of pain may repeat NTG, one tab every 5 minutes up to 3 tablets total over 15 minutes.  If no relief CALL 911.  If you have dizziness/lightheadness  while taking NTG, stop taking and call 911.         Heart Healthy diet  DO NOT STOP ASPRIN OR BRILINTA STOPPING MAY CAUSE A HEART ATTACK  If your shortness of breath increases call our office.    Stop smoking'  Follow-up Information    Dorris Carnes, MD Follow up on 10/18/2016.   Specialty:  Cardiology Why:  at 1:30 pm with her PA Vin.    Contact information: [place] [address] [phone]            Discharge Medications   Current Discharge Medication List    START taking these medications   Details  acetaminophen (TYLENOL) 325 MG tablet Take 2 tablets (650 mg total) by mouth every 4 (four) hours as needed for headache or mild pain.    atorvastatin (LIPITOR) 80 MG tablet Take 1 tablet (80 mg total) by mouth daily at 6 PM. Qty: 30 tablet, Refills: 6    nitroGLYCERIN (NITROSTAT) 0.4 MG SL tablet Place 1 tablet (0.4 mg total) under the tongue every 5 (five) minutes as needed for chest pain. Qty: 25 tablet, Refills: 4    ticagrelor (BRILINTA) 90 MG TABS tablet Take 1 tablet (90 mg total) by mouth 2 (two) times daily. Qty: 60 tablet, Refills: 11      CONTINUE these medications which have NOT CHANGED   Details  amLODipine  (NORVASC) 2.5 MG tablet Take 2.5 mg by mouth daily.    aspirin EC 81 MG tablet Take 1 tablet (81 mg total) by mouth daily. Qty: 90 tablet, Refills: 3    BIOTIN PO Take 1 tablet by mouth daily.    carvedilol (COREG) 25 MG tablet Take 25 mg by mouth 2 (two) times daily with a meal.    Cholecalciferol (VITAMIN D PO) Take 3 capsules by mouth once a week.    cyclobenzaprine (FLEXERIL) 10 MG tablet Take 5-10 mg by mouth daily as needed  for muscle spasms    hydrochlorothiazide (MICROZIDE) 12.5 MG capsule Take 12.5 mg by mouth daily.    ketotifen (ZADITOR) 0.025 % ophthalmic solution Place 1 drop into both eyes as needed (for allergies).    LORazepam (ATIVAN) 0.5 MG tablet Take 0.5 mg by mouth daily.     MAGNESIUM PO Take 1 tablet by mouth 4 (four) times a week.    omeprazole (PRILOSEC) 20 MG capsule Take 20 mg by mouth daily.    polyethylene glycol (MIRALAX / GLYCOLAX) packet Take 17 g by mouth daily.     Probiotic Product (PROBIOTIC PO) Take 1 capsule by mouth daily.    progesterone (PROMETRIUM) 100 MG capsule Take 100 mg by mouth daily.    valsartan (DIOVAN) 320 MG tablet Take 320 mg by mouth daily.         Aspirin prescribed at discharge?  Yes High Intensity Statin Prescribed? (Lipitor 40-80mg  or Crestor 20-40mg ): Yes Beta Blocker Prescribed? Yes For EF <40%, was ACEI/ARB Prescribed? No: na but is on arb ADP Receptor Inhibitor Prescribed? (i.e. Plavix etc.-Includes Medically Managed Patients): Yes For EF <40%, Aldosterone Inhibitor Prescribed? No: na Was EF assessed during THIS hospitalization? Yes Was Cardiac Rehab II ordered? (Included Medically managed Patients): Yes   Outstanding Labs/Studies   Recheck hepatic and lipid in 6-8 weeks.   Duration of Discharge Encounter   Greater than 30 minutes including physician time.  Signed, Cecilie Kicks NP 10/07/2016, 10:25 AM   I have examined the patient and reviewed assessment and plan and discussed with patient.  Agree  with above as stated.  She needs aggressive secondary prevention.  Follow lipids. Right radial site with moderate bruising, 2+ radial pulse.  RRR, no wheezing. Monitor SHOB on Brilinta.  May need to switch to clopidogrel going forward if [facility] does not relent.  Would give 300 mg clopidogrel load followed by 75 mg daily.  Long term, she willl need lipid lowering therapy for hyperlipidemia, regular exercise and a healthy diet. ARB , beta blocker for HTN.  Larae Grooms

## 2016-10-07 NOTE — Care Management Note (Addendum)
Case Management Note  Patient Details  Name: Rebecca Dalton MRN: [id number] Date of Birth: 1961-03-01  Subjective/Objective:   S/p coronary stent, will be on brilinta, NCM gave patient the 30 savings card she states she will go to [facility] on [address] to pick up 30 day free.  Also she would like MD to switch her to something less expensive because she knows she has not met her deductible yet and the co pay will be high for her.  NCM informed her that will call her with co pay amount when received from benefit check.  Patient phone is  [phone].  She is for dc today, pta indep. NCM called patient on phone to let her know what co pay is 362.79.                   Action/Plan:   Expected Discharge Date:  10/07/16               Expected Discharge Plan:  Home/Self Care  In-House Referral:     Discharge planning Services  CM Consult  Post Acute Care Choice:    Choice offered to:     DME Arranged:    DME Agency:     HH Arranged:    HH Agency:     Status of Service:  Completed, signed off  If discussed at [employer] of Stay Meetings, dates discussed:    Additional Comments:  Zenon Mayo, RN 10/07/2016, 10:41 AM

## 2016-10-07 NOTE — Progress Notes (Signed)
S/W REGINALD  @ [place] # [phone]   3. BRILINTA   90 MG BID   COVER- YES  CO-PAY- $ 362.79  TIER- 2 DRUG  PRIOR APPROVAL- NO   [facility]ERR[facility]   PATIENT HASN'T MET HER DEDUCTIBLE

## 2016-10-07 NOTE — Discharge Instructions (Signed)
Call [facility] at [phone] if any bleeding, swelling or drainage at cath site.  May shower, no tub baths for 48 hours for groin sticks. No lifting over 5 pounds for 3 days.  No Driving for 3 days  Take 1 NTG, under your tongue, while sitting.  If no relief of pain may repeat NTG, one tab every 5 minutes up to 3 tablets total over 15 minutes.  If no relief CALL 911.  If you have dizziness/lightheadness  while taking NTG, stop taking and call 911.         Heart Healthy diet  DO NOT STOP ASPRIN OR BRILINTA STOPPING MAY CAUSE A HEART ATTACK  If your shortness of breath increases call our office.     Stop smoking

## 2016-10-07 NOTE — Progress Notes (Signed)
Tech offered Pt a bath. Pt stated she is able to perform her own bath. Tech gave Pt bath supplies.

## 2016-10-10 ENCOUNTER — Telehealth: Payer: Self-pay | Admitting: Internal Medicine

## 2016-10-10 NOTE — Telephone Encounter (Signed)
Would switch patient to Effient daily

## 2016-10-10 NOTE — Telephone Encounter (Signed)
New Message    Pt has a question on blood thinner  Pt c/o medication issue:  1. Name of Medication:  ticagrelor (BRILINTA) 90 MG TABS tablet Take 1 tablet (90 mg total) by mouth 2 (two) times daily.     2. How are you currently taking this medication (dosage and times per day)? 1 tab 2x day  3. Are you having a reaction (difficulty breathing--STAT)? Sob  4. What is your medication issue? The sob is not getting better

## 2016-10-10 NOTE — Telephone Encounter (Signed)
Patient stated she is still having SOB with taking Brilinta and would like to switch to something else. Will forward to Dr. Harrington Challenger for advisement.

## 2016-10-11 MED ORDER — PRASUGREL HCL 10 MG PO TABS: 10.0000 mg | ORAL_TABLET | Freq: Every day | ORAL | 11 refills | Status: AC

## 2016-10-11 NOTE — Telephone Encounter (Signed)
Spoke with patient.  She has SOB with Brilinta.  She has some caffeine with each dose which helps a little but the SOB is concerning to her.  Advised of Dr. Harrington Challenger recommendation to switch to Effient once a day.  Advised patient to take one more dose of Brilinta this evening and then starting tomorrow morning take Effient once a day.  Pt verbalizes understanding and is appreciative for the assistance.  She is aware of upcoming appt 10/28/16.

## 2016-10-18 ENCOUNTER — Ambulatory Visit: Payer: Managed Care, Other (non HMO) | Admitting: Physician Assistant

## 2016-10-24 ENCOUNTER — Telehealth (HOSPITAL_COMMUNITY): Payer: Self-pay | Admitting: Family Medicine

## 2016-10-24 NOTE — Telephone Encounter (Signed)
Patient has [place] and insurance benefits verified; Patient has $0 co-payment, deductible $6000/$3297.80 has been met, out of pocket $6000/$3297.80 has been met, 0% co-insurance, 36 visits covered. Pre-cert is required. Passport/reference [phone]. Joann made aware.

## 2016-10-27 ENCOUNTER — Encounter: Payer: Self-pay | Admitting: Internal Medicine

## 2016-10-27 NOTE — Progress Notes (Signed)
Cardiology Office Note   Date:  10/28/2016   ID:  Rebecca Dalton, DOB 1961-04-06, MRN [id number]  PCP:  Vidal Schwalbe, MD  Cardiologist:   Dorris Carnes, MD   F/U of CAD     History of Present Illness: Rebecca Dalton is a 56 y.o. female with a history of HTN,    I saw her in CP and SOB   Had stress test on Feb 15 that was high risk study  Ischemia in LAD distribution    Went on to have L heart cath   This showed 99% LAD lesion  Very mild dz elsewhere  Normal LV function  She underwent PTCA/DES to LAD   She was initially placed on Brilinta  More SOB      Switched to Effient  Breathing is better  She denies CP  No PND  Knees hurt but otherwise no muscle aches (actureally better since stent)  Quit smoking initially  Now back to 1/2 ppd  Trying     Current Meds  Medication Sig  . acetaminophen (TYLENOL) 325 MG tablet Take 2 tablets (650 mg total) by mouth every 4 (four) hours as needed for headache or mild pain.  Marland Kitchen amLODipine (NORVASC) 2.5 MG tablet Take 2.5 mg by mouth daily.  Marland Kitchen aspirin EC 81 MG tablet Take 1 tablet (81 mg total) by mouth daily.  Marland Kitchen atorvastatin (LIPITOR) 80 MG tablet Take 1 tablet (80 mg total) by mouth daily at 6 PM.  . BIOTIN PO Take 1 tablet by mouth daily.  . carvedilol (COREG) 25 MG tablet Take 25 mg by mouth 2 (two) times daily with a meal.  . Cholecalciferol (VITAMIN D PO) Take 3 capsules by mouth once a week.  . cyclobenzaprine (FLEXERIL) 10 MG tablet Take 5-10 mg by mouth daily as needed for muscle spasms  . hydrochlorothiazide (MICROZIDE) 12.5 MG capsule Take 12.5 mg by mouth daily.  Marland Kitchen ketotifen (ZADITOR) 0.025 % ophthalmic solution Place 1 drop into both eyes as needed (for allergies).  . LORazepam (ATIVAN) 0.5 MG tablet Take 0.5 mg by mouth daily.   Marland Kitchen MAGNESIUM PO Take 1 tablet by mouth 4 (four) times a week.  . nitroGLYCERIN (NITROSTAT) 0.4 MG SL tablet Place 1 tablet (0.4 mg total) under the tongue every 5 (five) minutes as needed for chest pain.  Marland Kitchen  omeprazole (PRILOSEC) 20 MG capsule Take 20 mg by mouth daily.  . polyethylene glycol (MIRALAX / GLYCOLAX) packet Take 17 g by mouth daily.   . prasugrel (EFFIENT) 10 MG TABS tablet Take 1 tablet (10 mg total) by mouth daily.  . Probiotic Product (PROBIOTIC PO) Take 1 capsule by mouth daily.  . progesterone (PROMETRIUM) 100 MG capsule Take 100 mg by mouth daily.  . valsartan (DIOVAN) 320 MG tablet Take 320 mg by mouth daily.     Allergies:   Penicillin g; Penicillins; Amlodipine besylate; Betamethasone; Codeine; Sulfa antibiotics; and Sulfamethoxazole   Past Medical History:  Diagnosis Date  . Abnormal cardiovascular stress test 10/07/2016  . Anxiety   . CAD in native artery 10/07/2016  . Chronic bronchitis ([place])   . Coronary artery disease   . GERD (gastroesophageal reflux disease)   . HLD (hyperlipidemia) 10/07/2016  . HTN (hypertension) 10/07/2016  . Hypertension   . Pneumonia    "twice" (10/06/2016)  . S/P angioplasty with stent, 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/06/16 10/07/2016    Past Surgical History:  Procedure Laterality Date  . ANTERIOR CERVICAL DECOMP/DISCECTOMY FUSION  ~  2005   C6-7  . BACK SURGERY    . CORONARY ANGIOPLASTY WITH STENT PLACEMENT  10/06/2016  . CORONARY STENT INTERVENTION N/A 10/06/2016   Procedure: Coronary Stent Intervention;  Surgeon: Burnell Blanks, MD;  Location: [facility];  Service: Cardiovascular;  Laterality: N/A;  . ELBOW ARTHROSCOPY Right 05/03/2016   Procedure: RIGHT ARTHROSCOPY ELBOW WITH REMOVAL FOREIGN BODIES;  Surgeon: Leanora Cover, MD;  Location: [facility];  Service: Orthopedics;  Laterality: Right;  . LEFT HEART CATH AND CORONARY ANGIOGRAPHY N/A 10/06/2016   Procedure: Left Heart Cath and Coronary Angiography;  Surgeon: Burnell Blanks, MD;  Location: [facility];  Service: Cardiovascular;  Laterality: N/A;  . REFRACTIVE SURGERY Bilateral   . TONSILLECTOMY       Social History:  The patient  reports that she has  been smoking.  She has a 19.50 pack-year smoking history. She has never used smokeless tobacco. She reports that she drinks about 12.6 oz of alcohol per week . She reports that she does not use drugs.   Family History:  The patient's family history includes Diabetes in her sister; Heart attack (age of onset: 67) in her sister; Heart attack (age of onset: 79) in her maternal grandmother; Heart attack (age of onset: 87) in her brother; Heart attack (age of onset: 25) in her father; Heart disease in her father.    ROS:  Please see the history of present illness. All other systems are reviewed and  Negative to the above problem except as noted.    PHYSICAL EXAM: VS:  BP 128/82   Pulse 84   Ht 5\' 2"  (1.575 m)   Wt 164 lb 3.2 oz (74.5 kg)   SpO2 98%   BMI 30.03 kg/m   GEN: Well nourished, well developed, in no acute distress  HEENT: normal  Neck: no JVD, carotid bruits, or masses Cardiac: RRR; no murmurs, rubs, or gallops,no edema  Respiratory:  clear to auscultation bilaterally, normal work of breathing GI: soft, nontender, nondistended, + BS  No hepatomegaly  MS: no deformity Moving all extremities   Skin: warm and dry, no rash Neuro:  Strength and sensation are intact Psych: euthymic mood, full affect   EKG:  EKG is not  ordered today.   Lipid Panel No results found for: CHOL, TRIG, HDL, CHOLHDL, VLDL, LDLCALC, LDLDIRECT    Wt Readings from Last 3 Encounters:  10/28/16 164 lb 3.2 oz (74.5 kg)  10/07/16 173 lb 11.6 oz (78.8 kg)  10/06/16 164 lb (74.4 kg)      ASSESSMENT AND PLAN:  1  CAD  Recent PTCA/DES to LAD  DOing good  Continue  F/U in June  2  HL  WIll need lipids in 2 months  Pt to see Dr Dema Severin  Have checked there and forward  3  Tob  Counselled on cessaint  4  HTN  Good control     Current medicines are reviewed at length with the patient today.  The patient does not have concerns regarding medicines.  Signed, Dorris Carnes, MD  10/28/2016 12:01 PM    [address], [place], [place]  [address] Phone: [phone]; Fax: [phone]

## 2016-10-28 ENCOUNTER — Ambulatory Visit (INDEPENDENT_AMBULATORY_CARE_PROVIDER_SITE_OTHER): Payer: Managed Care, Other (non HMO) | Admitting: Internal Medicine

## 2016-10-28 ENCOUNTER — Encounter: Payer: Self-pay | Admitting: Internal Medicine

## 2016-10-28 VITALS — BP 128/82 | HR 84 | Ht 62.0 in | Wt 164.2 lb

## 2016-10-28 DIAGNOSIS — I2510 Atherosclerotic heart disease of native coronary artery without angina pectoris: Secondary | ICD-10-CM

## 2016-10-28 DIAGNOSIS — Z720 Tobacco use: Secondary | ICD-10-CM | POA: Diagnosis not present

## 2016-10-28 DIAGNOSIS — E785 Hyperlipidemia, unspecified: Secondary | ICD-10-CM | POA: Diagnosis not present

## 2016-10-28 NOTE — Patient Instructions (Signed)
Medication Instructions:  Your physician recommends that you continue on your current medications as directed. Please refer to the Current Medication list given to you today.   Labwork: Labs to be drawn with PCP Dr. Dema Severin. Please have them forward results.  Testing/Procedures: None ordered  Follow-Up: Your physician recommends that you schedule a follow-up appointment in: June 2018 with Dr. Harrington Challenger.   Any Other Special Instructions Will Be Listed Below (If Applicable).     If you need a refill on your cardiac medications before your next appointment, please call your pharmacy.

## 2016-12-13 ENCOUNTER — Ambulatory Visit (HOSPITAL_COMMUNITY): Payer: Managed Care, Other (non HMO)

## 2016-12-19 ENCOUNTER — Encounter (HOSPITAL_COMMUNITY): Payer: Managed Care, Other (non HMO)

## 2016-12-19 ENCOUNTER — Telehealth (HOSPITAL_COMMUNITY): Payer: Self-pay | Admitting: Family Medicine

## 2016-12-19 NOTE — Telephone Encounter (Signed)
*  Updated insurance verified* [employer] - no co-payment, deductible $6000/$6000 has been met. Out of pocket $6000/6000 has been met, no co-insurance and 36 visits covered.  Passport/reference [phone]

## 2016-12-20 ENCOUNTER — Encounter (HOSPITAL_COMMUNITY)
Admission: RE | Admit: 2016-12-20 | Discharge: 2016-12-20 | Disposition: A | Discharge status: Home / Self Care | Payer: Managed Care, Other (non HMO) | Source: Ambulatory Visit | Attending: Internal Medicine | Admitting: Internal Medicine

## 2016-12-20 ENCOUNTER — Encounter (HOSPITAL_COMMUNITY): Payer: Self-pay

## 2016-12-20 VITALS — BP 109/72 | HR 76 | Ht 64.0 in | Wt 164.0 lb

## 2016-12-20 DIAGNOSIS — Z955 Presence of coronary angioplasty implant and graft: Secondary | ICD-10-CM

## 2016-12-20 NOTE — Progress Notes (Signed)
Cardiac Rehab Medication Review by a Pharmacist  Does the patient  feel that his/her medications are working for him/her? Yes  Has the patient been experiencing any side effects to the medications prescribed? No  Does the patient measure his/her own blood pressure or blood glucose at home? Yes, blood pressure runs around 117/77 mmHg  Does the patient have any problems obtaining medications due to transportation or finances? No - has been able to get Effient at price that is affordable now  Understanding of regimen: Good Understanding of indications: Good Potential of compliance: Good  Pharmacist comments:  Rebecca Dalton is a 31 yof who presents to cardiac rehab today in good spirits. She appears to have a good understanding of her medication regimen. Her broth is a pharmacist and has been able to help her with questions and obtaining her Effient at a more affordable cost. She did not express major concerns at this time.  Demetrius Charity, PharmD Acute Care Pharmacy Resident  Pager: [phone] 12/20/2016

## 2016-12-20 NOTE — Progress Notes (Signed)
Cardiac Individual Treatment Plan  Patient Details  Name: NAUTIA LEM MRN: [id number] Date of Birth: September 26, 1960 Referring Provider:     CARDIAC REHAB PHASE II ORIENTATION from 12/20/2016 in [place]  Referring Provider  Dorris Carnes, MD.      Initial Encounter Date:    CARDIAC REHAB PHASE II ORIENTATION from 12/20/2016 in [place]  Date  12/20/16  Referring Provider  Dorris Carnes, MD.      Visit Diagnosis: Stented coronary artery  Patient's Home Medications on Admission:  Current Outpatient Prescriptions:  .  amLODipine (NORVASC) 2.5 MG tablet, Take 2.5 mg by mouth daily., Disp: , Rfl:  .  aspirin EC 81 MG tablet, Take 1 tablet (81 mg total) by mouth daily., Disp: 90 tablet, Rfl: 3 .  atorvastatin (LIPITOR) 80 MG tablet, Take 1 tablet (80 mg total) by mouth daily at 6 PM., Disp: 30 tablet, Rfl: 6 .  BIOTIN PO, Take 1 tablet by mouth daily., Disp: , Rfl:  .  carvedilol (COREG) 25 MG tablet, Take 25 mg by mouth 2 (two) times daily with a meal., Disp: , Rfl:  .  Cholecalciferol (VITAMIN D PO), Take 3 capsules by mouth once a week., Disp: , Rfl:  .  cyclobenzaprine (FLEXERIL) 10 MG tablet, Take 5-10 mg by mouth daily as needed for muscle spasms, Disp: , Rfl:  .  hydrochlorothiazide (MICROZIDE) 12.5 MG capsule, Take 12.5 mg by mouth daily., Disp: , Rfl:  .  ketotifen (ZADITOR) 0.025 % ophthalmic solution, Place 1 drop into both eyes as needed (for allergies)., Disp: , Rfl:  .  LORazepam (ATIVAN) 0.5 MG tablet, Take 0.5 mg by mouth daily as needed. , Disp: , Rfl:  .  MAGNESIUM PO, Take 1 tablet by mouth 4 (four) times a week., Disp: , Rfl:  .  nitroGLYCERIN (NITROSTAT) 0.4 MG SL tablet, Place 1 tablet (0.4 mg total) under the tongue every 5 (five) minutes as needed for chest pain., Disp: 25 tablet, Rfl: 4 .  omeprazole (PRILOSEC) 20 MG capsule, Take 20 mg by mouth daily as needed. , Disp: , Rfl:  .  polyethylene glycol  (MIRALAX / GLYCOLAX) packet, Take 17 g by mouth daily. , Disp: , Rfl:  .  prasugrel (EFFIENT) 10 MG TABS tablet, Take 1 tablet (10 mg total) by mouth daily., Disp: 30 tablet, Rfl: 11 .  Probiotic Product (PROBIOTIC PO), Take 1 capsule by mouth daily., Disp: , Rfl:  .  valsartan (DIOVAN) 320 MG tablet, Take 320 mg by mouth daily., Disp: , Rfl:   Past Medical History: Past Medical History:  Diagnosis Date  . Abnormal cardiovascular stress test 10/07/2016  . Anxiety   . CAD in native artery 10/07/2016  . Chronic bronchitis ([place])   . Coronary artery disease   . GERD (gastroesophageal reflux disease)   . HLD (hyperlipidemia) 10/07/2016  . HTN (hypertension) 10/07/2016  . Hypertension   . Pneumonia    "twice" (10/06/2016)  . S/P angioplasty with stent, 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/06/16 10/07/2016    Tobacco Use: History  Smoking Status  . Former Smoker  . Packs/day: 0.50  . Years: 39.00  . Quit date: 10/06/2016  Smokeless Tobacco  . Never Used    Labs: Recent Review Flowsheet Data    There is no flowsheet data to display.      Capillary Blood Glucose: No results found for: GLUCAP   Exercise Target Goals: Date: 12/20/16  Exercise Program Goal: Individual  exercise prescription set with THRR, safety & activity barriers. Participant demonstrates ability to understand and report RPE using BORG scale, to self-measure pulse accurately, and to acknowledge the importance of the exercise prescription.  Exercise Prescription Goal: Starting with aerobic activity 30 plus minutes a day, 3 days per week for initial exercise prescription. Provide home exercise prescription and guidelines that participant acknowledges understanding prior to discharge.  Activity Barriers & Risk Stratification:     Activity Barriers & Cardiac Risk Stratification - 12/20/16 1227      Activity Barriers & Cardiac Risk Stratification   Activity Barriers None   Cardiac Risk Stratification Moderate      6 Minute Walk:      6 Minute Walk    Row Name 12/20/16 1028         6 Minute Walk   Phase Initial     Distance 1413 feet     Walk Time 6 minutes     # of Rest Breaks 0     MPH 2.68     METS 3.68     RPE 7     VO2 Peak 12.89     Symptoms No     Resting HR 76 bpm     Resting BP 109/72     Max Ex. HR 99 bpm     Max Ex. BP 128/70     2 Minute Post BP 124/80        Oxygen Initial Assessment:   Oxygen Re-Evaluation:   Oxygen Discharge (Final Oxygen Re-Evaluation):   Initial Exercise Prescription:     Initial Exercise Prescription - 12/20/16 1200      Date of Initial Exercise RX and Referring Provider   Date 12/20/16   Referring Provider Dorris Carnes, MD.     Treadmill   MPH 2.6   Grade 1   Minutes 10   METs 3.35     Bike   Level 1.1   Minutes 10   METs 3.77     NuStep   Level 4   SPM 90   Minutes 10   METs 3     Prescription Details   Frequency (times per week) 3   Duration Progress to 30 minutes of continuous aerobic without signs/symptoms of physical distress     Intensity   THRR 40-80% of Max Heartrate 65-131   Ratings of Perceived Exertion 11-13   Perceived Dyspnea 0-4     Progression   Progression Continue to progress workloads to maintain intensity without signs/symptoms of physical distress.     Resistance Training   Training Prescription Yes   Weight 3lbs.      Perform Capillary Blood Glucose checks as needed.  Exercise Prescription Changes:   Exercise Comments:   Exercise Goals and Review:     Exercise Goals    Row Name 12/20/16 1227             Exercise Goals   Increase Physical Activity Yes       Intervention Provide advice, education, support and counseling about physical activity/exercise needs.;Develop an individualized exercise prescription for aerobic and resistive training based on initial evaluation findings, risk stratification, comorbidities and participant's personal goals.       Expected Outcomes Achievement of increased  cardiorespiratory fitness and enhanced flexibility, muscular endurance and strength shown through measurements of functional capacity and personal statement of participant.          Exercise Goals Re-Evaluation :    Discharge Exercise Prescription (Final Exercise Prescription  Changes):   Nutrition:  Target Goals: Understanding of nutrition guidelines, daily intake of sodium 1500mg , cholesterol 200mg , calories 30% from fat and 7% or less from saturated fats, daily to have 5 or more servings of fruits and vegetables.  Biometrics:     Pre Biometrics - 12/20/16 1029      Pre Biometrics   Height 5\' 4"  (1.626 m)   Weight 164 lb 0.4 oz (74.4 kg)   Waist Circumference 35.5 inches   Hip Circumference 39.75 inches   Waist to Hip Ratio 0.89 %   BMI (Calculated) 28.2   Triceps Skinfold 39 mm   % Body Fat 40.4 %   Grip Strength 28.5 kg  c/o rt elbow pain & numbness in fingers after 2nd attempt.   Flexibility 17.5 in   Single Leg Stand 30 seconds       Nutrition Therapy Plan and Nutrition Goals:   Nutrition Discharge: Nutrition Scores:   Nutrition Goals Re-Evaluation:   Nutrition Goals Re-Evaluation:   Nutrition Goals Discharge (Final Nutrition Goals Re-Evaluation):   Psychosocial: Target Goals: Acknowledge presence or absence of significant depression and/or stress, maximize coping skills, provide positive support system. Participant is able to verbalize types and ability to use techniques and skills needed for reducing stress and depression.  Initial Review & Psychosocial Screening:     Initial Psych Review & Screening - 12/20/16 1148      Initial Review   Current issues with Current Depression;Current Stress Concerns   Source of Stress Concerns Chronic Illness;Unable to participate in former interests or hobbies;Financial   Comments recent broken elbow with decreased functional ability in that extremity.  this has created inability to work and perform usual  activities.      Family Dynamics   Good Support System? Yes  partner      Barriers   Psychosocial barriers to participate in program The patient should benefit from training in stress management and relaxation.     Screening Interventions   Interventions Yes;Encouraged to exercise;Provide feedback about the scores to participant;To provide support and resources with identified psychosocial needs   Expected Outcomes Short Term goal: Utilizing psychosocial counselor, staff and physician to assist with identification of specific Stressors or current issues interfering with healing process. Setting desired goal for each stressor or current issue identified.;Long Term Goal: Stressors or current issues are controlled or eliminated.      Quality of Life Scores:     Quality of Life - 12/20/16 1110      Quality of Life Scores   Health/Function Pre 17.89 %   Socioeconomic Pre 20.25 %   Psych/Spiritual Pre 18.07 %   Family Pre 18.38 %   GLOBAL Pre 18.45 %      PHQ-9: Recent Review Flowsheet Data    There is no flowsheet data to display.     Interpretation of Total Score  Total Score Depression Severity:  1-4 = Minimal depression, 5-9 = Mild depression, 10-14 = Moderate depression, 15-19 = Moderately severe depression, 20-27 = Severe depression   Psychosocial Evaluation and Intervention:   Psychosocial Re-Evaluation:   Psychosocial Discharge (Final Psychosocial Re-Evaluation):   Vocational Rehabilitation: Provide vocational rehab assistance to qualifying candidates.   Vocational Rehab Evaluation & Intervention:   Education: Education Goals: Education classes will be provided on a weekly basis, covering required topics. Participant will state understanding/return demonstration of topics presented.  Learning Barriers/Preferences:   Education Topics: Count Your Pulse:  -Group instruction provided by verbal instruction, demonstration, patient participation and  written  materials to support subject.  Instructors address importance of being able to find your pulse and how to count your pulse when at home without a heart monitor.  Patients get hands on experience counting their pulse with staff help and individually.   Heart Attack, Angina, and Risk Factor Modification:  -Group instruction provided by verbal instruction, video, and written materials to support subject.  Instructors address signs and symptoms of angina and heart attacks.    Also discuss risk factors for heart disease and how to make changes to improve heart health risk factors.   Functional Fitness:  -Group instruction provided by verbal instruction, demonstration, patient participation, and written materials to support subject.  Instructors address safety measures for doing things around the house.  Discuss how to get up and down off the floor, how to pick things up properly, how to safely get out of a chair without assistance, and balance training.   Meditation and Mindfulness:  -Group instruction provided by verbal instruction, patient participation, and written materials to support subject.  Instructor addresses importance of mindfulness and meditation practice to help reduce stress and improve awareness.  Instructor also leads participants through a meditation exercise.    Stretching for Flexibility and Mobility:  -Group instruction provided by verbal instruction, patient participation, and written materials to support subject.  Instructors lead participants through series of stretches that are designed to increase flexibility thus improving mobility.  These stretches are additional exercise for major muscle groups that are typically performed during regular warm up and cool down.   Hands Only CPR Anytime:  -Group instruction provided by verbal instruction, video, patient participation and written materials to support subject.  Instructors co-teach with AHA video for hands only CPR.   Participants get hands on experience with mannequins.   Nutrition I class: Heart Healthy Eating:  -Group instruction provided by PowerPoint slides, verbal discussion, and written materials to support subject matter. The instructor gives an explanation and review of the Therapeutic Lifestyle Changes diet recommendations, which includes a discussion on lipid goals, dietary fat, sodium, fiber, plant stanol/sterol esters, sugar, and the components of a well-balanced, healthy diet.   Nutrition II class: Lifestyle Skills:  -Group instruction provided by PowerPoint slides, verbal discussion, and written materials to support subject matter. The instructor gives an explanation and review of label reading, grocery shopping for heart health, heart healthy recipe modifications, and ways to make healthier choices when eating out.   Diabetes Question & Answer:  -Group instruction provided by PowerPoint slides, verbal discussion, and written materials to support subject matter. The instructor gives an explanation and review of diabetes co-morbidities, pre- and post-prandial blood glucose goals, pre-exercise blood glucose goals, signs, symptoms, and treatment of hypoglycemia and hyperglycemia, and foot care basics.   Diabetes Blitz:  -Group instruction provided by PowerPoint slides, verbal discussion, and written materials to support subject matter. The instructor gives an explanation and review of the physiology behind type 1 and type 2 diabetes, diabetes medications and rational behind using different medications, pre- and post-prandial blood glucose recommendations and Hemoglobin A1c goals, diabetes diet, and exercise including blood glucose guidelines for exercising safely.    Portion Distortion:  -Group instruction provided by PowerPoint slides, verbal discussion, written materials, and food models to support subject matter. The instructor gives an explanation of serving size versus portion size, changes in  portions sizes over the last 20 years, and what consists of a serving from each food group.   Stress Management:  -Group instruction  provided by verbal instruction, video, and written materials to support subject matter.  Instructors review role of stress in heart disease and how to cope with stress positively.     Exercising on Your Own:  -Group instruction provided by verbal instruction, power point, and written materials to support subject.  Instructors discuss benefits of exercise, components of exercise, frequency and intensity of exercise, and end points for exercise.  Also discuss use of nitroglycerin and activating EMS.  Review options of places to exercise outside of rehab.  Review guidelines for sex with heart disease.   Cardiac Drugs I:  -Group instruction provided by verbal instruction and written materials to support subject.  Instructor reviews cardiac drug classes: antiplatelets, anticoagulants, beta blockers, and statins.  Instructor discusses reasons, side effects, and lifestyle considerations for each drug class.   Cardiac Drugs II:  -Group instruction provided by verbal instruction and written materials to support subject.  Instructor reviews cardiac drug classes: angiotensin converting enzyme inhibitors (ACE-I), angiotensin II receptor blockers (ARBs), nitrates, and calcium channel blockers.  Instructor discusses reasons, side effects, and lifestyle considerations for each drug class.   Anatomy and Physiology of the Circulatory System:  -Group instruction provided by verbal instruction, video, and written materials to support subject.  Reviews functional anatomy of heart, how it relates to various diagnoses, and what role the heart plays in the overall system.   Knowledge Questionnaire Score:     Knowledge Questionnaire Score - 12/20/16 1109      Knowledge Questionnaire Score   Pre Score 26/28      Core Components/Risk Factors/Patient Goals at Admission:      Personal Goals and Risk Factors at Admission - 12/20/16 1137      Core Components/Risk Factors/Patient Goals on Admission    Weight Management Yes;Weight Loss   Intervention Weight Management: Develop a combined nutrition and exercise program designed to reach desired caloric intake, while maintaining appropriate intake of nutrient and fiber, sodium and fats, and appropriate energy expenditure required for the weight goal.;Weight Management: Provide education and appropriate resources to help participant work on and attain dietary goals.   Admit Weight 164 lb 0.4 oz (74.4 kg)   Goal Weight: Short Term 150 lb (68 kg)   Expected Outcomes Short Term: Continue to assess and modify interventions until short term weight is achieved;Long Term: Adherence to nutrition and physical activity/exercise program aimed toward attainment of established weight goal;Weight Loss: Understanding of general recommendations for a balanced deficit meal plan, which promotes 1-2 lb weight loss per week and includes a negative energy balance of [phone] kcal/d   Tobacco Cessation Yes   Intervention Assist the participant in steps to quit. Provide individualized education and counseling about committing to Tobacco Cessation, relapse prevention, and pharmacological support that can be provided by physician.   Expected Outcomes Long Term: Complete abstinence from all tobacco products for at least 12 months from quit date.   Hypertension Yes   Intervention Provide education on lifestyle modifcations including regular physical activity/exercise, weight management, moderate sodium restriction and increased consumption of fresh fruit, vegetables, and low fat dairy, alcohol moderation, and smoking cessation.;Monitor prescription use compliance.   Expected Outcomes Short Term: Continued assessment and intervention until BP is < 140/59mm HG in hypertensive participants. < 130/13mm HG in hypertensive participants with diabetes, heart  failure or chronic kidney disease.;Long Term: Maintenance of blood pressure at goal levels.   Lipids Yes   Intervention Provide education and support for participant on nutrition & aerobic/resistive exercise along  with prescribed medications to achieve LDL 70mg , HDL >40mg .   Expected Outcomes Short Term: Participant states understanding of desired cholesterol values and is compliant with medications prescribed. Participant is following exercise prescription and nutrition guidelines.;Long Term: Cholesterol controlled with medications as prescribed, with individualized exercise RX and with personalized nutrition plan. Value goals: LDL < 70mg , HDL > 40 mg.   Stress Yes   Intervention Offer individual and/or small group education and counseling on adjustment to heart disease, stress management and health-related lifestyle change. Teach and support self-help strategies.;Refer participants experiencing significant psychosocial distress to appropriate mental health specialists for further evaluation and treatment. When possible, include family members and significant others in education/counseling sessions.   Expected Outcomes Short Term: Participant demonstrates changes in health-related behavior, relaxation and other stress management skills, ability to obtain effective social support, and compliance with psychotropic medications if prescribed.;Long Term: Emotional wellbeing is indicated by absence of clinically significant psychosocial distress or social isolation.   Personal Goal Other Yes   Personal Goal Short Term:  To develop a walking habit.  Long Term:  Weight lose with goal weight <150lb, to improve overall health.    Intervention provide home exercise plan.  provide exercise and nutritional educational opportunities to promote weight loss, heart healthy lifestyle and improved overall health.   Expected Outcomes pt will activitly participate in home exercise plan. pt will participate in cardiac rehab  exercise, education and nutrition opportunties to decrease CAD risk factors and improve overall health.      Core Components/Risk Factors/Patient Goals Review:    Core Components/Risk Factors/Patient Goals at Discharge (Final Review):    ITP Comments:     ITP Comments    Row Name 12/20/16 0859           ITP Comments Medical Director:  Dr. Fransico Him           Comments: Patient attended orientation from 0750 to [phone] to review rules and guidelines for program. Completed 6 minute walk test, Intitial ITP, and exercise prescription.  VSS. Telemetry-sinus rhythm,   Asymptomatic.

## 2016-12-21 ENCOUNTER — Encounter (HOSPITAL_COMMUNITY): Payer: Managed Care, Other (non HMO)

## 2016-12-23 ENCOUNTER — Encounter (HOSPITAL_COMMUNITY): Payer: Managed Care, Other (non HMO)

## 2016-12-26 ENCOUNTER — Encounter (HOSPITAL_COMMUNITY)
Admission: RE | Admit: 2016-12-26 | Discharge: 2016-12-26 | Disposition: A | Discharge status: Home / Self Care | Payer: Managed Care, Other (non HMO) | Source: Ambulatory Visit | Attending: Internal Medicine | Admitting: Internal Medicine

## 2016-12-26 DIAGNOSIS — Z955 Presence of coronary angioplasty implant and graft: Secondary | ICD-10-CM

## 2016-12-26 NOTE — Progress Notes (Signed)
Daily Session Note  Patient Details  Name: Rebecca Dalton MRN: [id number] Date of Birth: 10-Feb-1961 Referring Provider:     CARDIAC REHAB PHASE II ORIENTATION from 12/20/2016 in [place]  Referring Provider  Dorris Carnes, MD.      Encounter Date: 12/26/2016  Check In:     Session Check In - 12/26/16 1417      Check-In   Location [facility]   Staff Present Cleda Mccreedy, MS, Exercise Physiologist;Amber Fair, MS, ACSM RCEP, Exercise Physiologist;An Lannan Venetia Maxon, RN, BSN   Supervising physician immediately available to respond to emergencies [facility] immediately available   Physician(s) Dr. Sloan Leiter   Medication changes reported     No   Fall or balance concerns reported    No   Tobacco Cessation No Change   Warm-up and Cool-down Performed as group-led instruction   Resistance Training Performed Yes   VAD Patient? No     Pain Assessment   Currently in Pain? No/denies   Multiple Pain Sites No      Capillary Blood Glucose: No results found for this or any previous visit (from the past 24 hour(s)).    History  Smoking Status  . Former Smoker  . Packs/day: 0.50  . Years: 39.00  . Quit date: 10/06/2016  Smokeless Tobacco  . Never Used    Goals Met:  Exercise tolerated well  Goals Unmet:  Not Applicable  Comments: Pt started cardiac rehab today.  Pt tolerated light exercise without difficulty. VSS, telemetry-Sinus Rhythm, asymptomatic.  Medication list reconciled. Pt denies barriers to medicaiton compliance.  PSYCHOSOCIAL ASSESSMENT:  PHQ-0. Pt exhibits positive coping skills, hopeful outlook with supportive family. No psychosocial needs identified at this time, no psychosocial interventions necessary.   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   Pt oriented to exercise equipment and routine.    Understanding  verbalized.Barnet Pall, RN,BSN 12/26/2016 4:33 PM   Dr. Fransico Him is Medical Director for Cardiac Rehab at [facility].

## 2016-12-28 ENCOUNTER — Encounter: Payer: Self-pay | Admitting: Internal Medicine

## 2016-12-28 ENCOUNTER — Encounter (HOSPITAL_COMMUNITY)
Admission: RE | Admit: 2016-12-28 | Discharge: 2016-12-28 | Disposition: A | Discharge status: Home / Self Care | Payer: Managed Care, Other (non HMO) | Source: Ambulatory Visit | Attending: Internal Medicine | Admitting: Internal Medicine

## 2016-12-28 DIAGNOSIS — Z955 Presence of coronary angioplasty implant and graft: Secondary | ICD-10-CM | POA: Diagnosis not present

## 2016-12-29 NOTE — Progress Notes (Signed)
Rebecca Dalton says she has cut down but continues to smoke 2- 4 cigarettes a day. Encouraged smoking cessation. Patient given the number to 1-800-quit now. I also told [name] about the smoking cessation classes at the cancer center at [facility], RN,BSN 12/29/2016 10:34 AM

## 2016-12-30 ENCOUNTER — Encounter (HOSPITAL_COMMUNITY)
Admission: RE | Admit: 2016-12-30 | Discharge: 2016-12-30 | Disposition: A | Discharge status: Home / Self Care | Payer: Managed Care, Other (non HMO) | Source: Ambulatory Visit | Attending: Internal Medicine | Admitting: Internal Medicine

## 2016-12-30 DIAGNOSIS — Z955 Presence of coronary angioplasty implant and graft: Secondary | ICD-10-CM

## 2017-01-02 ENCOUNTER — Encounter (HOSPITAL_COMMUNITY)
Admission: RE | Admit: 2017-01-02 | Discharge: 2017-01-02 | Disposition: A | Discharge status: Home / Self Care | Payer: Managed Care, Other (non HMO) | Source: Ambulatory Visit | Attending: Internal Medicine | Admitting: Internal Medicine

## 2017-01-02 DIAGNOSIS — Z955 Presence of coronary angioplasty implant and graft: Secondary | ICD-10-CM

## 2017-01-04 ENCOUNTER — Encounter (HOSPITAL_COMMUNITY)
Admission: RE | Admit: 2017-01-04 | Discharge: 2017-01-04 | Disposition: A | Discharge status: Home / Self Care | Payer: Managed Care, Other (non HMO) | Source: Ambulatory Visit | Attending: Internal Medicine | Admitting: Internal Medicine

## 2017-01-04 DIAGNOSIS — Z955 Presence of coronary angioplasty implant and graft: Secondary | ICD-10-CM

## 2017-01-06 ENCOUNTER — Encounter (HOSPITAL_COMMUNITY)
Admission: RE | Admit: 2017-01-06 | Discharge: 2017-01-06 | Disposition: A | Discharge status: Home / Self Care | Payer: Managed Care, Other (non HMO) | Source: Ambulatory Visit | Attending: Internal Medicine | Admitting: Internal Medicine

## 2017-01-06 DIAGNOSIS — Z955 Presence of coronary angioplasty implant and graft: Secondary | ICD-10-CM | POA: Diagnosis not present

## 2017-01-09 ENCOUNTER — Encounter (HOSPITAL_COMMUNITY)
Admission: RE | Admit: 2017-01-09 | Discharge: 2017-01-09 | Disposition: A | Discharge status: Home / Self Care | Payer: Managed Care, Other (non HMO) | Source: Ambulatory Visit | Attending: Internal Medicine | Admitting: Internal Medicine

## 2017-01-09 DIAGNOSIS — Z955 Presence of coronary angioplasty implant and graft: Secondary | ICD-10-CM

## 2017-01-09 NOTE — Progress Notes (Signed)
Reviewed home exercise with pt today.  Pt plans to walk 49min -1 hour for exercise, 2x/week in addition to coming to cardiac rehab. Reviewed THR, pulse, RPE, sign and symptoms, NTG use, and when to call 911 or MD.  Also discussed weather considerations and indoor options.  Pt voiced understanding.    Taichi Repka [employer]

## 2017-01-10 NOTE — Progress Notes (Signed)
Cardiac Individual Treatment Plan  Patient Details  Name: Rebecca Dalton MRN: [id number] Date of Birth: 08-08-1961 Referring Provider:     CARDIAC REHAB PHASE II ORIENTATION from 12/20/2016 in [facility]  Referring Provider  Dorris Carnes, MD.      Initial Encounter Date:    CARDIAC REHAB PHASE II ORIENTATION from 12/20/2016 in [facility]  Date  12/20/16  Referring Provider  Dorris Carnes, MD.      Visit Diagnosis: Stented coronary artery  Patient's Home Medications on Admission:  Current Outpatient Prescriptions:  .  amLODipine (NORVASC) 2.5 MG tablet, Take 2.5 mg by mouth daily., Disp: , Rfl:  .  aspirin EC 81 MG tablet, Take 1 tablet (81 mg total) by mouth daily., Disp: 90 tablet, Rfl: 3 .  atorvastatin (LIPITOR) 80 MG tablet, Take 1 tablet (80 mg total) by mouth daily at 6 PM., Disp: 30 tablet, Rfl: 6 .  BIOTIN PO, Take 1 tablet by mouth daily., Disp: , Rfl:  .  carvedilol (COREG) 25 MG tablet, Take 25 mg by mouth 2 (two) times daily with a meal., Disp: , Rfl:  .  Cholecalciferol (VITAMIN D PO), Take 3 capsules by mouth once a week., Disp: , Rfl:  .  cyclobenzaprine (FLEXERIL) 10 MG tablet, Take 5-10 mg by mouth daily as needed for muscle spasms, Disp: , Rfl:  .  hydrochlorothiazide (MICROZIDE) 12.5 MG capsule, Take 12.5 mg by mouth daily., Disp: , Rfl:  .  ketotifen (ZADITOR) 0.025 % ophthalmic solution, Place 1 drop into both eyes as needed (for allergies)., Disp: , Rfl:  .  LORazepam (ATIVAN) 0.5 MG tablet, Take 0.5 mg by mouth daily as needed. , Disp: , Rfl:  .  MAGNESIUM PO, Take 1 tablet by mouth 4 (four) times a week., Disp: , Rfl:  .  nitroGLYCERIN (NITROSTAT) 0.4 MG SL tablet, Place 1 tablet (0.4 mg total) under the tongue every 5 (five) minutes as needed for chest pain., Disp: 25 tablet, Rfl: 4 .  omeprazole (PRILOSEC) 20 MG capsule, Take 20 mg by mouth daily as needed. , Disp: , Rfl:  .  polyethylene glycol  (MIRALAX / GLYCOLAX) packet, Take 17 g by mouth daily. , Disp: , Rfl:  .  Probiotic Product (PROBIOTIC PO), Take 1 capsule by mouth daily., Disp: , Rfl:  .  valsartan (DIOVAN) 320 MG tablet, Take 320 mg by mouth daily., Disp: , Rfl:   Past Medical History: Past Medical History:  Diagnosis Date  . Abnormal cardiovascular stress test 10/07/2016  . Anxiety   . CAD in native artery 10/07/2016  . Chronic bronchitis ([place])   . Coronary artery disease   . GERD (gastroesophageal reflux disease)   . HLD (hyperlipidemia) 10/07/2016  . HTN (hypertension) 10/07/2016  . Hypertension   . Pneumonia    "twice" (10/06/2016)  . S/P angioplasty with stent, 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/06/16 10/07/2016    Tobacco Use: History  Smoking Status  . Former Smoker  . Packs/day: 0.50  . Years: 39.00  . Quit date: 10/06/2016  Smokeless Tobacco  . Never Used    Labs: Recent Review Flowsheet Data    There is no flowsheet data to display.      Capillary Blood Glucose: No results found for: GLUCAP   Exercise Target Goals:    Exercise Program Goal: Individual exercise prescription set with THRR, safety & activity barriers. Participant demonstrates ability to understand and report RPE using BORG scale, to self-measure  pulse accurately, and to acknowledge the importance of the exercise prescription.  Exercise Prescription Goal: Starting with aerobic activity 30 plus minutes a day, 3 days per week for initial exercise prescription. Provide home exercise prescription and guidelines that participant acknowledges understanding prior to discharge.  Activity Barriers & Risk Stratification:     Activity Barriers & Cardiac Risk Stratification - 12/20/16 1227      Activity Barriers & Cardiac Risk Stratification   Activity Barriers None   Cardiac Risk Stratification Moderate      6 Minute Walk:     6 Minute Walk    Row Name 12/20/16 1028         6 Minute Walk   Phase Initial     Distance 1413 feet     Walk Time  6 minutes     # of Rest Breaks 0     MPH 2.68     METS 3.68     RPE 7     VO2 Peak 12.89     Symptoms No     Resting HR 76 bpm     Resting BP 109/72     Max Ex. HR 99 bpm     Max Ex. BP 128/70     2 Minute Post BP 124/80        Oxygen Initial Assessment:   Oxygen Re-Evaluation:   Oxygen Discharge (Final Oxygen Re-Evaluation):   Initial Exercise Prescription:     Initial Exercise Prescription - 12/20/16 1200      Date of Initial Exercise RX and Referring Provider   Date 12/20/16   Referring Provider Dorris Carnes, MD.     Treadmill   MPH 2.6   Grade 1   Minutes 10   METs 3.35     Bike   Level 1.1   Minutes 10   METs 3.77     NuStep   Level 4   SPM 90   Minutes 10   METs 3     Prescription Details   Frequency (times per week) 3   Duration Progress to 30 minutes of continuous aerobic without signs/symptoms of physical distress     Intensity   THRR 40-80% of Max Heartrate 65-131   Ratings of Perceived Exertion 11-13   Perceived Dyspnea 0-4     Progression   Progression Continue to progress workloads to maintain intensity without signs/symptoms of physical distress.     Resistance Training   Training Prescription Yes   Weight 3lbs.      Perform Capillary Blood Glucose checks as needed.  Exercise Prescription Changes:     Exercise Prescription Changes    Row Name 01/10/17 1400             Response to Exercise   Blood Pressure (Admit) 122/80       Blood Pressure (Exercise) 148/84       Blood Pressure (Exit) 130/86       Heart Rate (Admit) 83 bpm       Heart Rate (Exercise) 112 bpm       Heart Rate (Exit) 82 bpm       Rating of Perceived Exertion (Exercise) 12       Symptoms none       Duration Progress to 45 minutes of aerobic exercise without signs/symptoms of physical distress       Intensity THRR unchanged         Progression   Progression Continue to progress workloads to maintain intensity without signs/symptoms  of physical  distress.       Average METs 3.4         Resistance Training   Training Prescription Yes       Weight 3lbs.       Reps 10-15       Time 10 Minutes         Treadmill   MPH 2.7       Grade 2       Minutes 15       METs 3.71         NuStep   Level 4       SPM 90       Minutes 15       METs 3         Home Exercise Plan   Plans to continue exercise at Home (comment)       Frequency Add 2 additional days to program exercise sessions.       Initial Home Exercises Provided 01/09/17          Exercise Comments:     Exercise Comments    Row Name 01/10/17 1432           Exercise Comments Reviewed HEP with patient 01/09/17          Exercise Goals and Review:     Exercise Goals    Row Name 12/20/16 1227             Exercise Goals   Increase Physical Activity Yes       Intervention Provide advice, education, support and counseling about physical activity/exercise needs.;Develop an individualized exercise prescription for aerobic and resistive training based on initial evaluation findings, risk stratification, comorbidities and participant's personal goals.       Expected Outcomes Achievement of increased cardiorespiratory fitness and enhanced flexibility, muscular endurance and strength shown through measurements of functional capacity and personal statement of participant.          Exercise Goals Re-Evaluation :     Exercise Goals Re-Evaluation    [place] Name 01/09/17 1619             Exercise Goal Re-Evaluation   Exercise Goals Review Increase Physical Activity;Increase Strenth and Stamina       Comments Reviewed home exercise with pt today.  Pt plans to walk 18min -1 hour for exercise, 2x/week in addition to coming to cardiac rehab. Reviewed THR, pulse, RPE, sign and symptoms, NTG use, and when to call 911 or MD.  Also discussed weather considerations and indoor options.  Pt voiced understanding.       Expected Outcomes Pt will be compliant with HEP and improve  in cardiorespiratory fitness levels           Discharge Exercise Prescription (Final Exercise Prescription Changes):     Exercise Prescription Changes - 01/10/17 1400      Response to Exercise   Blood Pressure (Admit) 122/80   Blood Pressure (Exercise) 148/84   Blood Pressure (Exit) 130/86   Heart Rate (Admit) 83 bpm   Heart Rate (Exercise) 112 bpm   Heart Rate (Exit) 82 bpm   Rating of Perceived Exertion (Exercise) 12   Symptoms none   Duration Progress to 45 minutes of aerobic exercise without signs/symptoms of physical distress   Intensity THRR unchanged     Progression   Progression Continue to progress workloads to maintain intensity without signs/symptoms of physical distress.   Average METs 3.4     Resistance Training  Training Prescription Yes   Weight 3lbs.   Reps 10-15   Time 10 Minutes     Treadmill   MPH 2.7   Grade 2   Minutes 15   METs 3.71     NuStep   Level 4   SPM 90   Minutes 15   METs 3     Home Exercise Plan   Plans to continue exercise at Home (comment)   Frequency Add 2 additional days to program exercise sessions.   Initial Home Exercises Provided 01/09/17      Nutrition:  Target Goals: Understanding of nutrition guidelines, daily intake of sodium 1500mg , cholesterol 200mg , calories 30% from fat and 7% or less from saturated fats, daily to have 5 or more servings of fruits and vegetables.  Biometrics:     Pre Biometrics - 12/20/16 1029      Pre Biometrics   Height 5\' 4"  (1.626 m)   Weight 164 lb 0.4 oz (74.4 kg)   Waist Circumference 35.5 inches   Hip Circumference 39.75 inches   Waist to Hip Ratio 0.89 %   BMI (Calculated) 28.2   Triceps Skinfold 39 mm   % Body Fat 40.4 %   Grip Strength 28.5 kg  c/o rt elbow pain & numbness in fingers after 2nd attempt.   Flexibility 17.5 in   Single Leg Stand 30 seconds       Nutrition Therapy Plan and Nutrition Goals:   Nutrition Discharge: Nutrition Scores:   Nutrition  Goals Re-Evaluation:   Nutrition Goals Re-Evaluation:   Nutrition Goals Discharge (Final Nutrition Goals Re-Evaluation):   Psychosocial: Target Goals: Acknowledge presence or absence of significant depression and/or stress, maximize coping skills, provide positive support system. Participant is able to verbalize types and ability to use techniques and skills needed for reducing stress and depression.  Initial Review & Psychosocial Screening:     Initial Psych Review & Screening - 12/20/16 1148      Initial Review   Current issues with Current Depression;Current Stress Concerns   Source of Stress Concerns Chronic Illness;Unable to participate in former interests or hobbies;Financial   Comments recent broken elbow with decreased functional ability in that extremity.  this has created inability to work and perform usual activities.      Family Dynamics   Good Support System? Yes  partner      Barriers   Psychosocial barriers to participate in program The patient should benefit from training in stress management and relaxation.     Screening Interventions   Interventions Yes;Encouraged to exercise;Provide feedback about the scores to participant;To provide support and resources with identified psychosocial needs   Expected Outcomes Short Term goal: Utilizing psychosocial counselor, staff and physician to assist with identification of specific Stressors or current issues interfering with healing process. Setting desired goal for each stressor or current issue identified.;Long Term Goal: Stressors or current issues are controlled or eliminated.      Quality of Life Scores:     Quality of Life - 12/20/16 1110      Quality of Life Scores   Health/Function Pre 17.89 %   Socioeconomic Pre 20.25 %   Psych/Spiritual Pre 18.07 %   Family Pre 18.38 %   GLOBAL Pre 18.45 %      PHQ-9: Recent Review Flowsheet Data    There is no flowsheet data to display.     Interpretation of  Total Score  Total Score Depression Severity:  1-4 = Minimal depression, 5-9 = Mild  depression, 10-14 = Moderate depression, 15-19 = Moderately severe depression, 20-27 = Severe depression   Psychosocial Evaluation and Intervention:     Psychosocial Evaluation - 01/10/17 1454      Psychosocial Evaluation & Interventions   Interventions Stress management education;Encouraged to exercise with the program and follow exercise prescription;Relaxation education   Comments pt health related anxiety is decreasing with each group exercise session. pt verbalizes desire to establish care with new PCP.  list of area PCP given to pt.     Expected Outcomes pt will demonstrate positive outlook with good coping skills    Continue Psychosocial Services  Follow up required by staff      Psychosocial Re-Evaluation:     Psychosocial Re-Evaluation    [place] Name 01/10/17 1456             Psychosocial Re-Evaluation   Current issues with Current Stress Concerns;Current Anxiety/Panic       Comments health related stress and anxiety.       Expected Outcomes pt will exhibit positive outlook with good coping skills.        Interventions Encouraged to attend Cardiac Rehabilitation for the exercise;Stress management education;Relaxation education       Continue Psychosocial Services  Follow up required by staff          Psychosocial Discharge (Final Psychosocial Re-Evaluation):     Psychosocial Re-Evaluation - 01/10/17 1456      Psychosocial Re-Evaluation   Current issues with Current Stress Concerns;Current Anxiety/Panic   Comments health related stress and anxiety.   Expected Outcomes pt will exhibit positive outlook with good coping skills.    Interventions Encouraged to attend Cardiac Rehabilitation for the exercise;Stress management education;Relaxation education   Continue Psychosocial Services  Follow up required by staff      Vocational Rehabilitation: Provide vocational rehab assistance  to qualifying candidates.   Vocational Rehab Evaluation & Intervention:   Education: Education Goals: Education classes will be provided on a weekly basis, covering required topics. Participant will state understanding/return demonstration of topics presented.  Learning Barriers/Preferences:   Education Topics: Count Your Pulse:  -Group instruction provided by verbal instruction, demonstration, patient participation and written materials to support subject.  Instructors address importance of being able to find your pulse and how to count your pulse when at home without a heart monitor.  Patients get hands on experience counting their pulse with staff help and individually.   Heart Attack, Angina, and Risk Factor Modification:  -Group instruction provided by verbal instruction, video, and written materials to support subject.  Instructors address signs and symptoms of angina and heart attacks.    Also discuss risk factors for heart disease and how to make changes to improve heart health risk factors.   Functional Fitness:  -Group instruction provided by verbal instruction, demonstration, patient participation, and written materials to support subject.  Instructors address safety measures for doing things around the house.  Discuss how to get up and down off the floor, how to pick things up properly, how to safely get out of a chair without assistance, and balance training.   Meditation and Mindfulness:  -Group instruction provided by verbal instruction, patient participation, and written materials to support subject.  Instructor addresses importance of mindfulness and meditation practice to help reduce stress and improve awareness.  Instructor also leads participants through a meditation exercise.    Stretching for Flexibility and Mobility:  -Group instruction provided by verbal instruction, patient participation, and written materials to support subject.  Instructors lead participants  through series of stretches that are designed to increase flexibility thus improving mobility.  These stretches are additional exercise for major muscle groups that are typically performed during regular warm up and cool down.   Hands Only CPR:  -Group verbal, video, and participation provides a basic overview of AHA guidelines for community CPR. Role-play of emergencies allow participants the opportunity to practice calling for help and chest compression technique with discussion of AED use.   Hypertension: -Group verbal and written instruction that provides a basic overview of hypertension including the most recent diagnostic guidelines, risk factor reduction with self-care instructions and medication management.    Nutrition I class: Heart Healthy Eating:  -Group instruction provided by PowerPoint slides, verbal discussion, and written materials to support subject matter. The instructor gives an explanation and review of the Therapeutic Lifestyle Changes diet recommendations, which includes a discussion on lipid goals, dietary fat, sodium, fiber, plant stanol/sterol esters, sugar, and the components of a well-balanced, healthy diet.   Nutrition II class: Lifestyle Skills:  -Group instruction provided by PowerPoint slides, verbal discussion, and written materials to support subject matter. The instructor gives an explanation and review of label reading, grocery shopping for heart health, heart healthy recipe modifications, and ways to make healthier choices when eating out.   Diabetes Question & Answer:  -Group instruction provided by PowerPoint slides, verbal discussion, and written materials to support subject matter. The instructor gives an explanation and review of diabetes co-morbidities, pre- and post-prandial blood glucose goals, pre-exercise blood glucose goals, signs, symptoms, and treatment of hypoglycemia and hyperglycemia, and foot care basics.   Diabetes Blitz:  -Group  instruction provided by PowerPoint slides, verbal discussion, and written materials to support subject matter. The instructor gives an explanation and review of the physiology behind type 1 and type 2 diabetes, diabetes medications and rational behind using different medications, pre- and post-prandial blood glucose recommendations and Hemoglobin A1c goals, diabetes diet, and exercise including blood glucose guidelines for exercising safely.    Portion Distortion:  -Group instruction provided by PowerPoint slides, verbal discussion, written materials, and food models to support subject matter. The instructor gives an explanation of serving size versus portion size, changes in portions sizes over the last 20 years, and what consists of a serving from each food group.   Stress Management:  -Group instruction provided by verbal instruction, video, and written materials to support subject matter.  Instructors review role of stress in heart disease and how to cope with stress positively.     Exercising on Your Own:  -Group instruction provided by verbal instruction, power point, and written materials to support subject.  Instructors discuss benefits of exercise, components of exercise, frequency and intensity of exercise, and end points for exercise.  Also discuss use of nitroglycerin and activating EMS.  Review options of places to exercise outside of rehab.  Review guidelines for sex with heart disease.   Cardiac Drugs I:  -Group instruction provided by verbal instruction and written materials to support subject.  Instructor reviews cardiac drug classes: antiplatelets, anticoagulants, beta blockers, and statins.  Instructor discusses reasons, side effects, and lifestyle considerations for each drug class.   Cardiac Drugs II:  -Group instruction provided by verbal instruction and written materials to support subject.  Instructor reviews cardiac drug classes: angiotensin converting enzyme inhibitors  (ACE-I), angiotensin II receptor blockers (ARBs), nitrates, and calcium channel blockers.  Instructor discusses reasons, side effects, and lifestyle considerations for each drug class.   CARDIAC  REHAB PHASE II EXERCISE from 12/28/2016 in [facility]  Date  12/28/16  Instruction Review Code  2- meets goals/outcomes      Anatomy and Physiology of the Circulatory System:  Group verbal and written instruction and models provide basic cardiac anatomy and physiology, with the coronary electrical and arterial systems. Review of: AMI, Angina, Valve disease, Heart Failure, Peripheral Artery Disease, Cardiac Arrhythmia, Pacemakers, and the ICD.   Other Education:  -Group or individual verbal, written, or video instructions that support the educational goals of the cardiac rehab program.   Knowledge Questionnaire Score:     Knowledge Questionnaire Score - 12/20/16 1109      Knowledge Questionnaire Score   Pre Score 26/28      Core Components/Risk Factors/Patient Goals at Admission:     Personal Goals and Risk Factors at Admission - 12/20/16 1137      Core Components/Risk Factors/Patient Goals on Admission    Weight Management Yes;Weight Loss   Intervention Weight Management: Develop a combined nutrition and exercise program designed to reach desired caloric intake, while maintaining appropriate intake of nutrient and fiber, sodium and fats, and appropriate energy expenditure required for the weight goal.;Weight Management: Provide education and appropriate resources to help participant work on and attain dietary goals.   Admit Weight 164 lb 0.4 oz (74.4 kg)   Goal Weight: Short Term 150 lb (68 kg)   Expected Outcomes Short Term: Continue to assess and modify interventions until short term weight is achieved;Long Term: Adherence to nutrition and physical activity/exercise program aimed toward attainment of established weight goal;Weight Loss: Understanding of  general recommendations for a balanced deficit meal plan, which promotes 1-2 lb weight loss per week and includes a negative energy balance of [phone] kcal/d   Tobacco Cessation Yes   Intervention Assist the participant in steps to quit. Provide individualized education and counseling about committing to Tobacco Cessation, relapse prevention, and pharmacological support that can be provided by physician.   Expected Outcomes Long Term: Complete abstinence from all tobacco products for at least 12 months from quit date.   Hypertension Yes   Intervention Provide education on lifestyle modifcations including regular physical activity/exercise, weight management, moderate sodium restriction and increased consumption of fresh fruit, vegetables, and low fat dairy, alcohol moderation, and smoking cessation.;Monitor prescription use compliance.   Expected Outcomes Short Term: Continued assessment and intervention until BP is < 140/9mm HG in hypertensive participants. < 130/92mm HG in hypertensive participants with diabetes, heart failure or chronic kidney disease.;Long Term: Maintenance of blood pressure at goal levels.   Lipids Yes   Intervention Provide education and support for participant on nutrition & aerobic/resistive exercise along with prescribed medications to achieve LDL 70mg , HDL >40mg .   Expected Outcomes Short Term: Participant states understanding of desired cholesterol values and is compliant with medications prescribed. Participant is following exercise prescription and nutrition guidelines.;Long Term: Cholesterol controlled with medications as prescribed, with individualized exercise RX and with personalized nutrition plan. Value goals: LDL < 70mg , HDL > 40 mg.   Stress Yes   Intervention Offer individual and/or small group education and counseling on adjustment to heart disease, stress management and health-related lifestyle change. Teach and support self-help strategies.;Refer participants  experiencing significant psychosocial distress to appropriate mental health specialists for further evaluation and treatment. When possible, include family members and significant others in education/counseling sessions.   Expected Outcomes Short Term: Participant demonstrates changes in health-related behavior, relaxation and other stress management skills, ability to obtain  effective social support, and compliance with psychotropic medications if prescribed.;Long Term: Emotional wellbeing is indicated by absence of clinically significant psychosocial distress or social isolation.   Personal Goal Other Yes   Personal Goal Short Term:  To develop a walking habit.  Long Term:  Weight lose with goal weight <150lb, to improve overall health.    Intervention provide home exercise plan.  provide exercise and nutritional educational opportunities to promote weight loss, heart healthy lifestyle and improved overall health.   Expected Outcomes pt will activitly participate in home exercise plan. pt will participate in cardiac rehab exercise, education and nutrition opportunties to decrease CAD risk factors and improve overall health.      Core Components/Risk Factors/Patient Goals Review:      Goals and Risk Factor Review    Row Name 01/06/17 1108             Core Components/Risk Factors/Patient Goals Review   Personal Goals Review Weight Management/Obesity;Tobacco Cessation;Hypertension;Lipids;Stress       Review pt with multiple risk factors demonstrates eagerness to participate in CR activities.  pt counseled on smoking cessation and continued efforts to decrease overall risk factors.  pt has been able to walk at home including hills.        Expected Outcomes t will participate in CR exercise, nutrition and lifestyle education opportunities to decrease overall CAD risk factors.           Core Components/Risk Factors/Patient Goals at Discharge (Final Review):      Goals and Risk Factor Review  - 01/06/17 1108      Core Components/Risk Factors/Patient Goals Review   Personal Goals Review Weight Management/Obesity;Tobacco Cessation;Hypertension;Lipids;Stress   Review pt with multiple risk factors demonstrates eagerness to participate in CR activities.  pt counseled on smoking cessation and continued efforts to decrease overall risk factors.  pt has been able to walk at home including hills.    Expected Outcomes t will participate in CR exercise, nutrition and lifestyle education opportunities to decrease overall CAD risk factors.       ITP Comments:     ITP Comments    Row Name 12/20/16 0859           ITP Comments Medical Director:  Dr. Fransico Him           Comments: Pt is making expected progress toward personal goals after completing 8 sessions. Recommend continued exercise and life style modification education including  stress management and relaxation techniques to decrease cardiac risk profile.

## 2017-01-11 ENCOUNTER — Encounter (HOSPITAL_COMMUNITY)
Admission: RE | Admit: 2017-01-11 | Discharge: 2017-01-11 | Disposition: A | Discharge status: Home / Self Care | Payer: Managed Care, Other (non HMO) | Source: Ambulatory Visit | Attending: Internal Medicine | Admitting: Internal Medicine

## 2017-01-11 DIAGNOSIS — Z955 Presence of coronary angioplasty implant and graft: Secondary | ICD-10-CM | POA: Diagnosis not present

## 2017-01-13 ENCOUNTER — Encounter (HOSPITAL_COMMUNITY)
Admission: RE | Admit: 2017-01-13 | Discharge: 2017-01-13 | Disposition: A | Discharge status: Home / Self Care | Payer: Managed Care, Other (non HMO) | Source: Ambulatory Visit | Attending: Internal Medicine | Admitting: Internal Medicine

## 2017-01-13 DIAGNOSIS — Z955 Presence of coronary angioplasty implant and graft: Secondary | ICD-10-CM | POA: Diagnosis not present

## 2017-01-16 ENCOUNTER — Encounter (HOSPITAL_COMMUNITY): Payer: Managed Care, Other (non HMO)

## 2017-01-18 ENCOUNTER — Encounter (HOSPITAL_COMMUNITY)
Admission: RE | Admit: 2017-01-18 | Discharge: 2017-01-18 | Disposition: A | Discharge status: Home / Self Care | Payer: Managed Care, Other (non HMO) | Source: Ambulatory Visit | Attending: Internal Medicine | Admitting: Internal Medicine

## 2017-01-18 DIAGNOSIS — Z955 Presence of coronary angioplasty implant and graft: Secondary | ICD-10-CM

## 2017-01-20 ENCOUNTER — Encounter (HOSPITAL_COMMUNITY)
Admission: RE | Admit: 2017-01-20 | Discharge: 2017-01-20 | Disposition: A | Discharge status: Home / Self Care | Payer: Managed Care, Other (non HMO) | Source: Ambulatory Visit | Attending: Internal Medicine | Admitting: Internal Medicine

## 2017-01-20 DIAGNOSIS — Z955 Presence of coronary angioplasty implant and graft: Secondary | ICD-10-CM | POA: Diagnosis not present

## 2017-01-23 ENCOUNTER — Encounter (HOSPITAL_COMMUNITY)
Admission: RE | Admit: 2017-01-23 | Discharge: 2017-01-23 | Disposition: A | Discharge status: Home / Self Care | Payer: Managed Care, Other (non HMO) | Source: Ambulatory Visit | Attending: Internal Medicine | Admitting: Internal Medicine

## 2017-01-23 DIAGNOSIS — Z955 Presence of coronary angioplasty implant and graft: Secondary | ICD-10-CM

## 2017-01-25 ENCOUNTER — Encounter (HOSPITAL_COMMUNITY)
Admission: RE | Admit: 2017-01-25 | Discharge: 2017-01-25 | Disposition: A | Discharge status: Home / Self Care | Payer: Managed Care, Other (non HMO) | Source: Ambulatory Visit | Attending: Internal Medicine | Admitting: Internal Medicine

## 2017-01-25 DIAGNOSIS — Z955 Presence of coronary angioplasty implant and graft: Secondary | ICD-10-CM

## 2017-01-27 ENCOUNTER — Encounter (HOSPITAL_COMMUNITY)
Admission: RE | Admit: 2017-01-27 | Discharge: 2017-01-27 | Disposition: A | Discharge status: Home / Self Care | Payer: Managed Care, Other (non HMO) | Source: Ambulatory Visit | Attending: Internal Medicine | Admitting: Internal Medicine

## 2017-01-27 DIAGNOSIS — Z955 Presence of coronary angioplasty implant and graft: Secondary | ICD-10-CM | POA: Diagnosis not present

## 2017-01-30 ENCOUNTER — Encounter (HOSPITAL_COMMUNITY)
Admission: RE | Admit: 2017-01-30 | Discharge: 2017-01-30 | Disposition: A | Discharge status: Home / Self Care | Payer: Managed Care, Other (non HMO) | Source: Ambulatory Visit | Attending: Internal Medicine | Admitting: Internal Medicine

## 2017-01-30 DIAGNOSIS — Z955 Presence of coronary angioplasty implant and graft: Secondary | ICD-10-CM

## 2017-02-01 ENCOUNTER — Encounter (HOSPITAL_COMMUNITY): Payer: Managed Care, Other (non HMO)

## 2017-02-03 ENCOUNTER — Encounter (HOSPITAL_COMMUNITY): Payer: Managed Care, Other (non HMO)

## 2017-02-06 ENCOUNTER — Encounter (HOSPITAL_COMMUNITY)
Admission: RE | Admit: 2017-02-06 | Discharge: 2017-02-06 | Disposition: A | Discharge status: Home / Self Care | Payer: Managed Care, Other (non HMO) | Source: Ambulatory Visit | Attending: Internal Medicine | Admitting: Internal Medicine

## 2017-02-06 ENCOUNTER — Ambulatory Visit: Payer: Managed Care, Other (non HMO) | Admitting: Internal Medicine

## 2017-02-06 DIAGNOSIS — Z955 Presence of coronary angioplasty implant and graft: Secondary | ICD-10-CM | POA: Diagnosis not present

## 2017-02-08 ENCOUNTER — Encounter (HOSPITAL_COMMUNITY)
Admission: RE | Admit: 2017-02-08 | Discharge: 2017-02-08 | Disposition: A | Discharge status: Home / Self Care | Payer: Managed Care, Other (non HMO) | Source: Ambulatory Visit | Attending: Internal Medicine | Admitting: Internal Medicine

## 2017-02-08 DIAGNOSIS — Z955 Presence of coronary angioplasty implant and graft: Secondary | ICD-10-CM | POA: Diagnosis not present

## 2017-02-08 NOTE — Progress Notes (Signed)
Cardiac Individual Treatment Plan  Patient Details  Name: Rebecca Dalton MRN: [id number] Date of Birth: 08/19/1961 Referring Provider:     CARDIAC REHAB PHASE II ORIENTATION from 12/20/2016 in [place]  Referring Provider  Dorris Carnes, MD.      Initial Encounter Date:    CARDIAC REHAB PHASE II ORIENTATION from 12/20/2016 in [place]  Date  12/20/16  Referring Provider  Dorris Carnes, MD.      Visit Diagnosis: Stented coronary artery  Patient's Home Medications on Admission:  Current Outpatient Prescriptions:  .  amLODipine (NORVASC) 2.5 MG tablet, Take 2.5 mg by mouth daily., Disp: , Rfl:  .  aspirin EC 81 MG tablet, Take 1 tablet (81 mg total) by mouth daily., Disp: 90 tablet, Rfl: 3 .  atorvastatin (LIPITOR) 80 MG tablet, Take 1 tablet (80 mg total) by mouth daily at 6 PM., Disp: 30 tablet, Rfl: 6 .  BIOTIN PO, Take 1 tablet by mouth daily., Disp: , Rfl:  .  carvedilol (COREG) 25 MG tablet, Take 25 mg by mouth 2 (two) times daily with a meal., Disp: , Rfl:  .  Cholecalciferol (VITAMIN D PO), Take 3 capsules by mouth once a week., Disp: , Rfl:  .  cyclobenzaprine (FLEXERIL) 10 MG tablet, Take 5-10 mg by mouth daily as needed for muscle spasms, Disp: , Rfl:  .  hydrochlorothiazide (MICROZIDE) 12.5 MG capsule, Take 12.5 mg by mouth daily., Disp: , Rfl:  .  ketotifen (ZADITOR) 0.025 % ophthalmic solution, Place 1 drop into both eyes as needed (for allergies)., Disp: , Rfl:  .  LORazepam (ATIVAN) 0.5 MG tablet, Take 0.5 mg by mouth daily as needed. , Disp: , Rfl:  .  MAGNESIUM PO, Take 1 tablet by mouth 4 (four) times a week., Disp: , Rfl:  .  nitroGLYCERIN (NITROSTAT) 0.4 MG SL tablet, Place 1 tablet (0.4 mg total) under the tongue every 5 (five) minutes as needed for chest pain., Disp: 25 tablet, Rfl: 4 .  omeprazole (PRILOSEC) 20 MG capsule, Take 20 mg by mouth daily as needed. , Disp: , Rfl:  .  polyethylene glycol  (MIRALAX / GLYCOLAX) packet, Take 17 g by mouth daily. , Disp: , Rfl:  .  Probiotic Product (PROBIOTIC PO), Take 1 capsule by mouth daily., Disp: , Rfl:  .  valsartan (DIOVAN) 320 MG tablet, Take 320 mg by mouth daily., Disp: , Rfl:   Past Medical History: Past Medical History:  Diagnosis Date  . Abnormal cardiovascular stress test 10/07/2016  . Anxiety   . CAD in native artery 10/07/2016  . Chronic bronchitis ([place])   . Coronary artery disease   . GERD (gastroesophageal reflux disease)   . HLD (hyperlipidemia) 10/07/2016  . HTN (hypertension) 10/07/2016  . Hypertension   . Pneumonia    "twice" (10/06/2016)  . S/P angioplasty with stent, 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/06/16 10/07/2016    Tobacco Use: History  Smoking Status  . Former Smoker  . Packs/day: 0.50  . Years: 39.00  . Quit date: 10/06/2016  Smokeless Tobacco  . Never Used    Labs: Recent Review Flowsheet Data    There is no flowsheet data to display.      Capillary Blood Glucose: No results found for: GLUCAP   Exercise Target Goals:    Exercise Program Goal: Individual exercise prescription set with THRR, safety & activity barriers. Participant demonstrates ability to understand and report RPE using BORG scale, to self-measure  pulse accurately, and to acknowledge the importance of the exercise prescription.  Exercise Prescription Goal: Starting with aerobic activity 30 plus minutes a day, 3 days per week for initial exercise prescription. Provide home exercise prescription and guidelines that participant acknowledges understanding prior to discharge.  Activity Barriers & Risk Stratification:     Activity Barriers & Cardiac Risk Stratification - 12/20/16 1227      Activity Barriers & Cardiac Risk Stratification   Activity Barriers None   Cardiac Risk Stratification Moderate      6 Minute Walk:     6 Minute Walk    Row Name 12/20/16 1028         6 Minute Walk   Phase Initial     Distance 1413 feet     Walk Time  6 minutes     # of Rest Breaks 0     MPH 2.68     METS 3.68     RPE 7     VO2 Peak 12.89     Symptoms No     Resting HR 76 bpm     Resting BP 109/72     Max Ex. HR 99 bpm     Max Ex. BP 128/70     2 Minute Post BP 124/80        Oxygen Initial Assessment:   Oxygen Re-Evaluation:   Oxygen Discharge (Final Oxygen Re-Evaluation):   Initial Exercise Prescription:     Initial Exercise Prescription - 12/20/16 1200      Date of Initial Exercise RX and Referring Provider   Date 12/20/16   Referring Provider Dorris Carnes, MD.     Treadmill   MPH 2.6   Grade 1   Minutes 10   METs 3.35     Bike   Level 1.1   Minutes 10   METs 3.77     NuStep   Level 4   SPM 90   Minutes 10   METs 3     Prescription Details   Frequency (times per week) 3   Duration Progress to 30 minutes of continuous aerobic without signs/symptoms of physical distress     Intensity   THRR 40-80% of Max Heartrate 65-131   Ratings of Perceived Exertion 11-13   Perceived Dyspnea 0-4     Progression   Progression Continue to progress workloads to maintain intensity without signs/symptoms of physical distress.     Resistance Training   Training Prescription Yes   Weight 3lbs.      Perform Capillary Blood Glucose checks as needed.  Exercise Prescription Changes:     Exercise Prescription Changes    Row Name 01/10/17 1400 01/24/17 1600           Response to Exercise   Blood Pressure (Admit) 122/80 124/78      Blood Pressure (Exercise) 148/84 142/72      Blood Pressure (Exit) 130/86 130/84      Heart Rate (Admit) 83 bpm 83 bpm      Heart Rate (Exercise) 112 bpm 112 bpm      Heart Rate (Exit) 82 bpm 83 bpm      Rating of Perceived Exertion (Exercise) 12 12      Symptoms none none      Duration Progress to 45 minutes of aerobic exercise without signs/symptoms of physical distress Progress to 45 minutes of aerobic exercise without signs/symptoms of physical distress      Intensity THRR  unchanged THRR unchanged  Progression   Progression Continue to progress workloads to maintain intensity without signs/symptoms of physical distress. Continue to progress workloads to maintain intensity without signs/symptoms of physical distress.      Average METs 3.4 4.5        Resistance Training   Training Prescription Yes Yes      Weight 3lbs. 4lbs      Reps 10-15 10-15      Time 10 Minutes 10 Minutes        Treadmill   MPH 2.7 2.7      Grade 2 2      Minutes 15 15      METs 3.71 3.71        Bike   Level  - 3.5      Minutes  - 15      METs  - 5.2        NuStep   Level 4  -      SPM 90  -      Minutes 15  -      METs 3  -        Home Exercise Plan   Plans to continue exercise at Home (comment) Home (comment)      Frequency Add 2 additional days to program exercise sessions. Add 2 additional days to program exercise sessions.      Initial Home Exercises Provided 01/09/17 01/09/17         Exercise Comments:     Exercise Comments    Row Name 01/10/17 1432 02/06/17 1508         Exercise Comments Reviewed HEP with patient 01/09/17 Reviewed METs and goals. Pt is tolerating exercise very well; will continue to monitor pt's progress and actvity levels.          Exercise Goals and Review:     Exercise Goals    Row Name 12/20/16 1227             Exercise Goals   Increase Physical Activity Yes       Intervention Provide advice, education, support and counseling about physical activity/exercise needs.;Develop an individualized exercise prescription for aerobic and resistive training based on initial evaluation findings, risk stratification, comorbidities and participant's personal goals.       Expected Outcomes Achievement of increased cardiorespiratory fitness and enhanced flexibility, muscular endurance and strength shown through measurements of functional capacity and personal statement of participant.          Exercise Goals Re-Evaluation :      Exercise Goals Re-Evaluation    [place] Name 01/09/17 1619 02/06/17 1504           Exercise Goal Re-Evaluation   Exercise Goals Review Increase Physical Activity;Increase Strenth and Stamina Increase Physical Activity;Increase Strenth and Stamina      Comments Reviewed home exercise with pt today.  Pt plans to walk 7min -1 hour for exercise, 2x/week in addition to coming to cardiac rehab. Reviewed THR, pulse, RPE, sign and symptoms, NTG use, and when to call 911 or MD.  Also discussed weather considerations and indoor options.  Pt voiced understanding. Pt stated "feeling good" and able to exercise and perform ADL's. Pt did state most activities are limited by R arm restrictions.      Expected Outcomes Pt will be compliant with HEP and improve in cardiorespiratory fitness levels Pt will be compliant with HEP and improve in cardiorespiratory fitness levels          Discharge Exercise Prescription (Final  Exercise Prescription Changes):     Exercise Prescription Changes - 01/24/17 1600      Response to Exercise   Blood Pressure (Admit) 124/78   Blood Pressure (Exercise) 142/72   Blood Pressure (Exit) 130/84   Heart Rate (Admit) 83 bpm   Heart Rate (Exercise) 112 bpm   Heart Rate (Exit) 83 bpm   Rating of Perceived Exertion (Exercise) 12   Symptoms none   Duration Progress to 45 minutes of aerobic exercise without signs/symptoms of physical distress   Intensity THRR unchanged     Progression   Progression Continue to progress workloads to maintain intensity without signs/symptoms of physical distress.   Average METs 4.5     Resistance Training   Training Prescription Yes   Weight 4lbs   Reps 10-15   Time 10 Minutes     Treadmill   MPH 2.7   Grade 2   Minutes 15   METs 3.71     Bike   Level 3.5   Minutes 15   METs 5.2     Home Exercise Plan   Plans to continue exercise at Home (comment)   Frequency Add 2 additional days to program exercise sessions.   Initial Home  Exercises Provided 01/09/17      Nutrition:  Target Goals: Understanding of nutrition guidelines, daily intake of sodium 1500mg , cholesterol 200mg , calories 30% from fat and 7% or less from saturated fats, daily to have 5 or more servings of fruits and vegetables.  Biometrics:     Pre Biometrics - 12/20/16 1029      Pre Biometrics   Height 5\' 4"  (1.626 m)   Weight 164 lb 0.4 oz (74.4 kg)   Waist Circumference 35.5 inches   Hip Circumference 39.75 inches   Waist to Hip Ratio 0.89 %   BMI (Calculated) 28.2   Triceps Skinfold 39 mm   % Body Fat 40.4 %   Grip Strength 28.5 kg  c/o rt elbow pain & numbness in fingers after 2nd attempt.   Flexibility 17.5 in   Single Leg Stand 30 seconds       Nutrition Therapy Plan and Nutrition Goals:   Nutrition Discharge: Nutrition Scores:   Nutrition Goals Re-Evaluation:   Nutrition Goals Re-Evaluation:   Nutrition Goals Discharge (Final Nutrition Goals Re-Evaluation):   Psychosocial: Target Goals: Acknowledge presence or absence of significant depression and/or stress, maximize coping skills, provide positive support system. Participant is able to verbalize types and ability to use techniques and skills needed for reducing stress and depression.  Initial Review & Psychosocial Screening:     Initial Psych Review & Screening - 12/20/16 1148      Initial Review   Current issues with Current Depression;Current Stress Concerns   Source of Stress Concerns Chronic Illness;Unable to participate in former interests or hobbies;Financial   Comments recent broken elbow with decreased functional ability in that extremity.  this has created inability to work and perform usual activities.      Family Dynamics   Good Support System? Yes  partner      Barriers   Psychosocial barriers to participate in program The patient should benefit from training in stress management and relaxation.     Screening Interventions   Interventions  Yes;Encouraged to exercise;Provide feedback about the scores to participant;To provide support and resources with identified psychosocial needs   Expected Outcomes Short Term goal: Utilizing psychosocial counselor, staff and physician to assist with identification of specific Stressors or current issues interfering with  healing process. Setting desired goal for each stressor or current issue identified.;Long Term Goal: Stressors or current issues are controlled or eliminated.      Quality of Life Scores:     Quality of Life - 01/27/17 1544      Quality of Life Scores   Health/Function Pre (P)  --  low QOL scores related to recent cardiac event and displeasure with care received from PCP.  pt has lost trust in PCP and is seeking new provider. list of [facility] providers given. pt will call to make appt.    GLOBAL Pre (P)  --  pt is showing some improvement in symptoms with increased exercise ability and peer support.  will continue to monitor.       PHQ-9: Recent Review Flowsheet Data    There is no flowsheet data to display.     Interpretation of Total Score  Total Score Depression Severity:  1-4 = Minimal depression, 5-9 = Mild depression, 10-14 = Moderate depression, 15-19 = Moderately severe depression, 20-27 = Severe depression   Psychosocial Evaluation and Intervention:     Psychosocial Evaluation - 01/10/17 1454      Psychosocial Evaluation & Interventions   Interventions Stress management education;Encouraged to exercise with the program and follow exercise prescription;Relaxation education   Comments pt health related anxiety is decreasing with each group exercise session. pt verbalizes desire to establish care with new PCP.  list of area PCP given to pt.     Expected Outcomes pt will demonstrate positive outlook with good coping skills    Continue Psychosocial Services  Follow up required by staff      Psychosocial Re-Evaluation:     Psychosocial Re-Evaluation    [place]  Name 01/10/17 1456 02/08/17 1703           Psychosocial Re-Evaluation   Current issues with Current Stress Concerns;Current Anxiety/Panic Current Stress Concerns;Current Anxiety/Panic      Comments health related stress and anxiety. health related stress and anxiety.      Expected Outcomes pt will exhibit positive outlook with good coping skills.  pt will exhibit positive outlook with good coping skills.       Interventions Encouraged to attend Cardiac Rehabilitation for the exercise;Stress management education;Relaxation education Encouraged to attend Cardiac Rehabilitation for the exercise;Stress management education;Relaxation education      Continue Psychosocial Services  Follow up required by staff Follow up required by staff         Psychosocial Discharge (Final Psychosocial Re-Evaluation):     Psychosocial Re-Evaluation - 02/08/17 1703      Psychosocial Re-Evaluation   Current issues with Current Stress Concerns;Current Anxiety/Panic   Comments health related stress and anxiety.   Expected Outcomes pt will exhibit positive outlook with good coping skills.    Interventions Encouraged to attend Cardiac Rehabilitation for the exercise;Stress management education;Relaxation education   Continue Psychosocial Services  Follow up required by staff      Vocational Rehabilitation: Provide vocational rehab assistance to qualifying candidates.   Vocational Rehab Evaluation & Intervention:   Education: Education Goals: Education classes will be provided on a weekly basis, covering required topics. Participant will state understanding/return demonstration of topics presented.  Learning Barriers/Preferences:   Education Topics: Count Your Pulse:  -Group instruction provided by verbal instruction, demonstration, patient participation and written materials to support subject.  Instructors address importance of being able to find your pulse and how to count your pulse when at home  without a heart monitor.  Patients  get hands on experience counting their pulse with staff help and individually.   Heart Attack, Angina, and Risk Factor Modification:  -Group instruction provided by verbal instruction, video, and written materials to support subject.  Instructors address signs and symptoms of angina and heart attacks.    Also discuss risk factors for heart disease and how to make changes to improve heart health risk factors.   CARDIAC REHAB PHASE II EXERCISE from 01/27/2017 in [place]  Date  01/11/17  Instruction Review Code  2- meets goals/outcomes      Functional Fitness:  -Group instruction provided by verbal instruction, demonstration, patient participation, and written materials to support subject.  Instructors address safety measures for doing things around the house.  Discuss how to get up and down off the floor, how to pick things up properly, how to safely get out of a chair without assistance, and balance training.   Meditation and Mindfulness:  -Group instruction provided by verbal instruction, patient participation, and written materials to support subject.  Instructor addresses importance of mindfulness and meditation practice to help reduce stress and improve awareness.  Instructor also leads participants through a meditation exercise.    Stretching for Flexibility and Mobility:  -Group instruction provided by verbal instruction, patient participation, and written materials to support subject.  Instructors lead participants through series of stretches that are designed to increase flexibility thus improving mobility.  These stretches are additional exercise for major muscle groups that are typically performed during regular warm up and cool down.   Hands Only CPR:  -Group verbal, video, and participation provides a basic overview of AHA guidelines for community CPR. Role-play of emergencies allow participants the opportunity to  practice calling for help and chest compression technique with discussion of AED use.   Hypertension: -Group verbal and written instruction that provides a basic overview of hypertension including the most recent diagnostic guidelines, risk factor reduction with self-care instructions and medication management.   CARDIAC REHAB PHASE II EXERCISE from 01/27/2017 in [place]  Date  01/27/17  Instruction Review Code  2- meets goals/outcomes       Nutrition I class: Heart Healthy Eating:  -Group instruction provided by PowerPoint slides, verbal discussion, and written materials to support subject matter. The instructor gives an explanation and review of the Therapeutic Lifestyle Changes diet recommendations, which includes a discussion on lipid goals, dietary fat, sodium, fiber, plant stanol/sterol esters, sugar, and the components of a well-balanced, healthy diet.   Nutrition II class: Lifestyle Skills:  -Group instruction provided by PowerPoint slides, verbal discussion, and written materials to support subject matter. The instructor gives an explanation and review of label reading, grocery shopping for heart health, heart healthy recipe modifications, and ways to make healthier choices when eating out.   Diabetes Question & Answer:  -Group instruction provided by PowerPoint slides, verbal discussion, and written materials to support subject matter. The instructor gives an explanation and review of diabetes co-morbidities, pre- and post-prandial blood glucose goals, pre-exercise blood glucose goals, signs, symptoms, and treatment of hypoglycemia and hyperglycemia, and foot care basics.   Diabetes Blitz:  -Group instruction provided by PowerPoint slides, verbal discussion, and written materials to support subject matter. The instructor gives an explanation and review of the physiology behind type 1 and type 2 diabetes, diabetes medications and rational behind  using different medications, pre- and post-prandial blood glucose recommendations and Hemoglobin A1c goals, diabetes diet, and exercise including blood glucose guidelines  for exercising safely.    Portion Distortion:  -Group instruction provided by PowerPoint slides, verbal discussion, written materials, and food models to support subject matter. The instructor gives an explanation of serving size versus portion size, changes in portions sizes over the last 20 years, and what consists of a serving from each food group.   Stress Management:  -Group instruction provided by verbal instruction, video, and written materials to support subject matter.  Instructors review role of stress in heart disease and how to cope with stress positively.     Exercising on Your Own:  -Group instruction provided by verbal instruction, power point, and written materials to support subject.  Instructors discuss benefits of exercise, components of exercise, frequency and intensity of exercise, and end points for exercise.  Also discuss use of nitroglycerin and activating EMS.  Review options of places to exercise outside of rehab.  Review guidelines for sex with heart disease.   Cardiac Drugs I:  -Group instruction provided by verbal instruction and written materials to support subject.  Instructor reviews cardiac drug classes: antiplatelets, anticoagulants, beta blockers, and statins.  Instructor discusses reasons, side effects, and lifestyle considerations for each drug class.   Cardiac Drugs II:  -Group instruction provided by verbal instruction and written materials to support subject.  Instructor reviews cardiac drug classes: angiotensin converting enzyme inhibitors (ACE-I), angiotensin II receptor blockers (ARBs), nitrates, and calcium channel blockers.  Instructor discusses reasons, side effects, and lifestyle considerations for each drug class.   CARDIAC REHAB PHASE II EXERCISE from 01/27/2017 in [place]  Date  12/28/16  Instruction Review Code  2- meets goals/outcomes      Anatomy and Physiology of the Circulatory System:  Group verbal and written instruction and models provide basic cardiac anatomy and physiology, with the coronary electrical and arterial systems. Review of: AMI, Angina, Valve disease, Heart Failure, Peripheral Artery Disease, Cardiac Arrhythmia, Pacemakers, and the ICD.   Other Education:  -Group or individual verbal, written, or video instructions that support the educational goals of the cardiac rehab program.   Knowledge Questionnaire Score:     Knowledge Questionnaire Score - 12/20/16 1109      Knowledge Questionnaire Score   Pre Score 26/28      Core Components/Risk Factors/Patient Goals at Admission:     Personal Goals and Risk Factors at Admission - 12/20/16 1137      Core Components/Risk Factors/Patient Goals on Admission    Weight Management Yes;Weight Loss   Intervention Weight Management: Develop a combined nutrition and exercise program designed to reach desired caloric intake, while maintaining appropriate intake of nutrient and fiber, sodium and fats, and appropriate energy expenditure required for the weight goal.;Weight Management: Provide education and appropriate resources to help participant work on and attain dietary goals.   Admit Weight 164 lb 0.4 oz (74.4 kg)   Goal Weight: Short Term 150 lb (68 kg)   Expected Outcomes Short Term: Continue to assess and modify interventions until short term weight is achieved;Long Term: Adherence to nutrition and physical activity/exercise program aimed toward attainment of established weight goal;Weight Loss: Understanding of general recommendations for a balanced deficit meal plan, which promotes 1-2 lb weight loss per week and includes a negative energy balance of [phone] kcal/d   Tobacco Cessation Yes   Intervention Assist the participant in steps to quit. Provide  individualized education and counseling about committing to Tobacco Cessation, relapse prevention, and pharmacological support that can be provided by physician.  Expected Outcomes Long Term: Complete abstinence from all tobacco products for at least 12 months from quit date.   Hypertension Yes   Intervention Provide education on lifestyle modifcations including regular physical activity/exercise, weight management, moderate sodium restriction and increased consumption of fresh fruit, vegetables, and low fat dairy, alcohol moderation, and smoking cessation.;Monitor prescription use compliance.   Expected Outcomes Short Term: Continued assessment and intervention until BP is < 140/39mm HG in hypertensive participants. < 130/67mm HG in hypertensive participants with diabetes, heart failure or chronic kidney disease.;Long Term: Maintenance of blood pressure at goal levels.   Lipids Yes   Intervention Provide education and support for participant on nutrition & aerobic/resistive exercise along with prescribed medications to achieve LDL 70mg , HDL >40mg .   Expected Outcomes Short Term: Participant states understanding of desired cholesterol values and is compliant with medications prescribed. Participant is following exercise prescription and nutrition guidelines.;Long Term: Cholesterol controlled with medications as prescribed, with individualized exercise RX and with personalized nutrition plan. Value goals: LDL < 70mg , HDL > 40 mg.   Stress Yes   Intervention Offer individual and/or small group education and counseling on adjustment to heart disease, stress management and health-related lifestyle change. Teach and support self-help strategies.;Refer participants experiencing significant psychosocial distress to appropriate mental health specialists for further evaluation and treatment. When possible, include family members and significant others in education/counseling sessions.   Expected Outcomes Short  Term: Participant demonstrates changes in health-related behavior, relaxation and other stress management skills, ability to obtain effective social support, and compliance with psychotropic medications if prescribed.;Long Term: Emotional wellbeing is indicated by absence of clinically significant psychosocial distress or social isolation.   Personal Goal Other Yes   Personal Goal Short Term:  To develop a walking habit.  Long Term:  Weight lose with goal weight <150lb, to improve overall health.    Intervention provide home exercise plan.  provide exercise and nutritional educational opportunities to promote weight loss, heart healthy lifestyle and improved overall health.   Expected Outcomes pt will activitly participate in home exercise plan. pt will participate in cardiac rehab exercise, education and nutrition opportunties to decrease CAD risk factors and improve overall health.      Core Components/Risk Factors/Patient Goals Review:      Goals and Risk Factor Review    Row Name 01/06/17 1108 02/08/17 1701           Core Components/Risk Factors/Patient Goals Review   Personal Goals Review Weight Management/Obesity;Tobacco Cessation;Hypertension;Lipids;Stress Weight Management/Obesity;Tobacco Cessation;Hypertension;Lipids;Stress      Review pt with multiple risk factors demonstrates eagerness to participate in CR activities.  pt counseled on smoking cessation and continued efforts to decrease overall risk factors.  pt has been able to walk at home including hills.  pt with multiple risk factors demonstrates eagerness to participate in CR activities.  pt counseled on smoking cessation and continued efforts to decrease overall risk factors.  pt has been able to walk at home including hills.  pt is pleased she was able to enjoying spending recent weekend in [place] without difficulty and mow her lawn without difficulty. pt reminded of heat/humidity exercise precautions. understanding  verbalized.       Expected Outcomes t will participate in CR exercise, nutrition and lifestyle education opportunities to decrease overall CAD risk factors.  t will participate in CR exercise, nutrition and lifestyle education opportunities to decrease overall CAD risk factors.          Core Components/Risk Factors/Patient Goals at Discharge (Final Review):  Goals and Risk Factor Review - 02/08/17 1701      Core Components/Risk Factors/Patient Goals Review   Personal Goals Review Weight Management/Obesity;Tobacco Cessation;Hypertension;Lipids;Stress   Review pt with multiple risk factors demonstrates eagerness to participate in CR activities.  pt counseled on smoking cessation and continued efforts to decrease overall risk factors.  pt has been able to walk at home including hills.  pt is pleased she was able to enjoying spending recent weekend in [place] without difficulty and mow her lawn without difficulty. pt reminded of heat/humidity exercise precautions. understanding verbalized.    Expected Outcomes t will participate in CR exercise, nutrition and lifestyle education opportunities to decrease overall CAD risk factors.       ITP Comments:     ITP Comments    Row Name 12/20/16 0859 02/07/17 1644 02/08/17 1701       ITP Comments Medical Director:  Dr. Fransico Him  Medical Director:  Dr. Fransico Him  Medical Director:  Dr. Fransico Him         Comments: Pt is making expected progress toward personal goals after completing 16 sessions. Recommend continued exercise and life style modification education including  stress management and relaxation techniques to decrease cardiac risk profile.

## 2017-02-09 NOTE — Progress Notes (Signed)
Cardiology Office Note   Date:  02/11/2017   ID:  ADONIS RYTHER, DOB [birth year], MRN [id number]  PCP:  Harlan Stains, MD  Cardiologist:   Dorris Carnes, MD   F/U of HTN and CAD     History of Present Illness: Rebecca Dalton is a 56 y.o. female with a history of HTN, and CAD   I saw her in march 2018  Had stress test on Feb 15 that was high risk study  Ischemia in LAD distribution    Went on to have L heart cath   This showed 99% LAD lesion  Very mild dz elsewhere  Normal LV function  She underwent PTCA/DES to LAD   She was initially placed on Brilinta  More SOB      Switched to Effient  Breathing is better  Since seen she denies CP  Brearhing is good  No dizziness Going to cardiac rehab   Cut back on cigs to 4 per day       Current Meds  Medication Sig  . amLODipine (NORVASC) 2.5 MG tablet Take 2.5 mg by mouth daily.  Marland Kitchen aspirin EC 81 MG tablet Take 1 tablet (81 mg total) by mouth daily.  Marland Kitchen atorvastatin (LIPITOR) 80 MG tablet Take 1 tablet (80 mg total) by mouth daily at 6 PM.  . BIOTIN PO Take 1 tablet by mouth daily.  . carvedilol (COREG) 25 MG tablet Take 25 mg by mouth 2 (two) times daily with a meal.  . Cholecalciferol (VITAMIN D PO) Take 3 capsules by mouth once a week.  . cyclobenzaprine (FLEXERIL) 10 MG tablet Take 5-10 mg by mouth daily as needed for muscle spasms  . hydrochlorothiazide (MICROZIDE) 12.5 MG capsule Take 12.5 mg by mouth daily.  Marland Kitchen ketotifen (ZADITOR) 0.025 % ophthalmic solution Place 1 drop into both eyes as needed (for allergies).  . LORazepam (ATIVAN) 0.5 MG tablet Take 0.5 mg by mouth daily as needed.   Marland Kitchen MAGNESIUM PO Take 1 tablet by mouth 4 (four) times a week.  . nitroGLYCERIN (NITROSTAT) 0.4 MG SL tablet Place 1 tablet (0.4 mg total) under the tongue every 5 (five) minutes as needed for chest pain.  Marland Kitchen omeprazole (PRILOSEC) 20 MG capsule Take 20 mg by mouth daily as needed.   . polyethylene glycol (MIRALAX / GLYCOLAX) packet Take 17 g by mouth  daily.   . Probiotic Product (PROBIOTIC PO) Take 1 capsule by mouth daily.  . valsartan (DIOVAN) 320 MG tablet Take 320 mg by mouth daily.     Allergies:   Penicillin g; Penicillins; Betamethasone; Codeine; Sulfa antibiotics; Sulfamethoxazole; and Sulfasalazine   Past Medical History:  Diagnosis Date  . Abnormal cardiovascular stress test 10/07/2016  . Anxiety   . CAD in native artery 10/07/2016  . Chronic bronchitis ([place])   . Coronary artery disease   . GERD (gastroesophageal reflux disease)   . HLD (hyperlipidemia) 10/07/2016  . HTN (hypertension) 10/07/2016  . Hypertension   . Pneumonia    "twice" (10/06/2016)  . S/P angioplasty with stent, 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/06/16 10/07/2016    Past Surgical History:  Procedure Laterality Date  . ANTERIOR CERVICAL DECOMP/DISCECTOMY FUSION  ~ 2005   C6-7  . BACK SURGERY    . CORONARY ANGIOPLASTY WITH STENT PLACEMENT  10/06/2016  . CORONARY STENT INTERVENTION N/A 10/06/2016   Procedure: Coronary Stent Intervention;  Surgeon: Burnell Blanks, MD;  Location: [facility];  Service: Cardiovascular;  Laterality: N/A;  . ELBOW  ARTHROSCOPY Right 05/03/2016   Procedure: RIGHT ARTHROSCOPY ELBOW WITH REMOVAL FOREIGN BODIES;  Surgeon: Leanora Cover, MD;  Location: [facility];  Service: Orthopedics;  Laterality: Right;  . LEFT HEART CATH AND CORONARY ANGIOGRAPHY N/A 10/06/2016   Procedure: Left Heart Cath and Coronary Angiography;  Surgeon: Burnell Blanks, MD;  Location: [facility];  Service: Cardiovascular;  Laterality: N/A;  . REFRACTIVE SURGERY Bilateral   . TONSILLECTOMY       Social History:  The patient  reports that she quit smoking about 4 months ago. She has a 19.50 pack-year smoking history. She has never used smokeless tobacco. She reports that she drinks about 12.6 oz of alcohol per week . She reports that she does not use drugs.   Family History:  The patient's family history includes Diabetes in her sister;  Heart attack (age of onset: 29) in her sister; Heart attack (age of onset: 73) in her maternal grandmother; Heart attack (age of onset: 20) in her brother; Heart attack (age of onset: 21) in her father; Heart disease in her father.    ROS:  Please see the history of present illness. All other systems are reviewed and  Negative to the above problem except as noted.    PHYSICAL EXAM: VS:  BP 122/74   Pulse 80   Ht 5\' 4"  (1.626 m)   Wt 75.2 kg (165 lb 12.8 oz)   SpO2 97%   BMI 28.46 kg/m   GEN: Well nourished, well developed, in no acute distress  HEENT: normal  Neck: no JVD, carotid bruits, or masses Cardiac: RRR; no murmurs, rubs, or gallops,no edema  Respiratory:  clear to auscultation bilaterally, normal work of breathing GI: soft, nontender, nondistended, + BS  No hepatomegaly  MS: no deformity Moving all extremities   Skin: warm and dry, no rash Neuro:  Strength and sensation are intact Psych: euthymic mood, full affect   EKG:  EKG is not  ordered today.   Lipid Panel No results found for: CHOL, TRIG, HDL, CHOLHDL, VLDL, LDLCALC, LDLDIRECT    Wt Readings from Last 3 Encounters:  02/10/17 75.2 kg (165 lb 12.8 oz)  12/20/16 74.4 kg (164 lb 0.4 oz)  10/28/16 74.5 kg (164 lb 3.2 oz)      ASSESSMENT AND PLAN: 1  CAD  S/p intervention  Continues to do well  Keep on same regimen  F/U in October  2  HL Continue current statin  3  Tob  Congratulated on cutting back  Continue .     Current medicines are reviewed at length with the patient today.  The patient does not have concerns regarding medicines.  Signed, Dorris Carnes, MD  02/11/2017 11:38 PM    [address], [place], [place]  [address] Phone: [phone]; Fax: [phone]

## 2017-02-09 NOTE — Progress Notes (Signed)
Cardiology Office Note   Date:  02/09/2017   ID:  MICHAELLE Dalton, DOB 07-22-1961, MRN [id number]  PCP:  Harlan Stains, MD  Cardiologist:   Dorris Carnes, MD       History of Present Illness: Rebecca Dalton is a 56 y.o. female with a history of HTN and CAD   Cath in Feb 2018  99% LAD  Underwent PTCA/DES to LAD   Mild dz elsewhere         No outpatient prescriptions have been marked as taking for the 02/10/17 encounter (Appointment) with Fay Records, MD.     Allergies:   Penicillin g; Penicillins; Betamethasone; Codeine; Sulfa antibiotics; and Sulfamethoxazole   Past Medical History:  Diagnosis Date  . Abnormal cardiovascular stress test 10/07/2016  . Anxiety   . CAD in native artery 10/07/2016  . Chronic bronchitis ([place])   . Coronary artery disease   . GERD (gastroesophageal reflux disease)   . HLD (hyperlipidemia) 10/07/2016  . HTN (hypertension) 10/07/2016  . Hypertension   . Pneumonia    "twice" (10/06/2016)  . S/P angioplasty with stent, 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/06/16 10/07/2016    Past Surgical History:  Procedure Laterality Date  . ANTERIOR CERVICAL DECOMP/DISCECTOMY FUSION  ~ 2005   C6-7  . BACK SURGERY    . CORONARY ANGIOPLASTY WITH STENT PLACEMENT  10/06/2016  . CORONARY STENT INTERVENTION N/A 10/06/2016   Procedure: Coronary Stent Intervention;  Surgeon: Burnell Blanks, MD;  Location: [facility];  Service: Cardiovascular;  Laterality: N/A;  . ELBOW ARTHROSCOPY Right 05/03/2016   Procedure: RIGHT ARTHROSCOPY ELBOW WITH REMOVAL FOREIGN BODIES;  Surgeon: Leanora Cover, MD;  Location: [facility];  Service: Orthopedics;  Laterality: Right;  . LEFT HEART CATH AND CORONARY ANGIOGRAPHY N/A 10/06/2016   Procedure: Left Heart Cath and Coronary Angiography;  Surgeon: Burnell Blanks, MD;  Location: [facility];  Service: Cardiovascular;  Laterality: N/A;  . REFRACTIVE SURGERY Bilateral   . TONSILLECTOMY       Social History:  The  patient  reports that she quit smoking about 4 months ago. She has a 19.50 pack-year smoking history. She has never used smokeless tobacco. She reports that she drinks about 12.6 oz of alcohol per week . She reports that she does not use drugs.   Family History:  The patient's family history includes Diabetes in her sister; Heart attack (age of onset: 30) in her sister; Heart attack (age of onset: 30) in her maternal grandmother; Heart attack (age of onset: 21) in her brother; Heart attack (age of onset: 40) in her father; Heart disease in her father.    ROS:  Please see the history of present illness. All other systems are reviewed and  Negative to the above problem except as noted.    PHYSICAL EXAM: VS:  There were no vitals taken for this visit.  GEN: Well nourished, well developed, in no acute distress  HEENT: normal  Neck: no JVD, carotid bruits, or masses Cardiac: RRR; no murmurs, rubs, or gallops,no edema  Respiratory:  clear to auscultation bilaterally, normal work of breathing GI: soft, nontender, nondistended, + BS  No hepatomegaly  MS: no deformity Moving all extremities   Skin: warm and dry, no rash Neuro:  Strength and sensation are intact Psych: euthymic mood, full affect   EKG:  EKG is ordered today.   Lipid Panel No results found for: CHOL, TRIG, HDL, CHOLHDL, VLDL, LDLCALC, LDLDIRECT  Wt Readings from Last 3 Encounters:  12/20/16 74.4 kg (164 lb 0.4 oz)  10/28/16 74.5 kg (164 lb 3.2 oz)  10/07/16 78.8 kg (173 lb 11.6 oz)      ASSESSMENT AND PLAN:     Current medicines are reviewed at length with the patient today.  The patient does not have concerns regarding medicines.  Signed, Dorris Carnes, MD  02/09/2017 9:55 PM    [address], [place], [place]  [address] Phone: [phone]; Fax: [phone]

## 2017-02-10 ENCOUNTER — Encounter (HOSPITAL_COMMUNITY)
Admission: RE | Admit: 2017-02-10 | Discharge: 2017-02-10 | Disposition: A | Discharge status: Home / Self Care | Payer: Managed Care, Other (non HMO) | Source: Ambulatory Visit | Attending: Internal Medicine | Admitting: Internal Medicine

## 2017-02-10 ENCOUNTER — Ambulatory Visit (INDEPENDENT_AMBULATORY_CARE_PROVIDER_SITE_OTHER): Payer: Managed Care, Other (non HMO) | Admitting: Internal Medicine

## 2017-02-10 ENCOUNTER — Encounter: Payer: Self-pay | Admitting: Internal Medicine

## 2017-02-10 VITALS — BP 122/74 | HR 80 | Ht 64.0 in | Wt 165.8 lb

## 2017-02-10 DIAGNOSIS — Z720 Tobacco use: Secondary | ICD-10-CM | POA: Diagnosis not present

## 2017-02-10 DIAGNOSIS — I2510 Atherosclerotic heart disease of native coronary artery without angina pectoris: Secondary | ICD-10-CM

## 2017-02-10 DIAGNOSIS — E785 Hyperlipidemia, unspecified: Secondary | ICD-10-CM

## 2017-02-10 DIAGNOSIS — Z955 Presence of coronary angioplasty implant and graft: Secondary | ICD-10-CM | POA: Diagnosis not present

## 2017-02-10 NOTE — Patient Instructions (Addendum)
Your physician recommends that you continue on your current medications as directed. Please refer to the Current Medication list given to you today.  Your physician recommends that you schedule a follow-up appointment at the endo of October with Dr. Harrington Challenger.

## 2017-02-13 ENCOUNTER — Encounter (HOSPITAL_COMMUNITY)
Admission: RE | Admit: 2017-02-13 | Discharge: 2017-02-13 | Disposition: A | Discharge status: Home / Self Care | Payer: Managed Care, Other (non HMO) | Source: Ambulatory Visit | Attending: Internal Medicine | Admitting: Internal Medicine

## 2017-02-13 DIAGNOSIS — Z955 Presence of coronary angioplasty implant and graft: Secondary | ICD-10-CM

## 2017-02-15 ENCOUNTER — Encounter (HOSPITAL_COMMUNITY)
Admission: RE | Admit: 2017-02-15 | Discharge: 2017-02-15 | Disposition: A | Discharge status: Home / Self Care | Payer: Managed Care, Other (non HMO) | Source: Ambulatory Visit | Attending: Internal Medicine | Admitting: Internal Medicine

## 2017-02-15 DIAGNOSIS — Z955 Presence of coronary angioplasty implant and graft: Secondary | ICD-10-CM

## 2017-02-17 ENCOUNTER — Encounter (HOSPITAL_COMMUNITY)
Admission: RE | Admit: 2017-02-17 | Discharge: 2017-02-17 | Disposition: A | Discharge status: Home / Self Care | Payer: Managed Care, Other (non HMO) | Source: Ambulatory Visit | Attending: Internal Medicine | Admitting: Internal Medicine

## 2017-02-17 DIAGNOSIS — Z955 Presence of coronary angioplasty implant and graft: Secondary | ICD-10-CM

## 2017-02-20 ENCOUNTER — Encounter (HOSPITAL_COMMUNITY)
Admission: RE | Admit: 2017-02-20 | Discharge: 2017-02-20 | Disposition: A | Discharge status: Home / Self Care | Payer: Managed Care, Other (non HMO) | Source: Ambulatory Visit | Attending: Internal Medicine | Admitting: Internal Medicine

## 2017-02-20 DIAGNOSIS — Z955 Presence of coronary angioplasty implant and graft: Secondary | ICD-10-CM

## 2017-02-20 NOTE — Progress Notes (Signed)
Nat Christen 56 y.o. female Nutrition Note Spoke with pt. Pt is overweight and wants to lose wt. Barriers to wt loss include difficulty exercising due to lack of motivation and weather (hot/humid). Ways to address pt barriers to wt loss discussed. Nutrition Survey reviewed with pt. Pt is following Step 1 of the Therapeutic Lifestyle Changes diet. Pt is pre-diabetic according to her last A1c of 6.0 12/02/16. Pt states she was unaware of pre-diabetes dx but is quite familiar with DM due to pt's sister dx with type 1 DM ~50+ years ago. Pt expressed understanding of the information reviewed. Pt aware of nutrition education classes offered and plans on attending nutrition classes. No results found for: HGBA1C Wt Readings from Last 3 Encounters:  02/10/17 165 lb 12.8 oz (75.2 kg)  12/20/16 164 lb 0.4 oz (74.4 kg)  10/28/16 164 lb 3.2 oz (74.5 kg)   Nutrition Diagnosis ? Food-and nutrition-related knowledge deficit related to lack of exposure to information as related to diagnosis of: ? CVD ? Pre-DM ? Overweight related to excessive energy intake as evidenced by a BMI of 28.2 Nutrition Intervention ? Benefits of adopting Therapeutic Lifestyle Changes discussed when Medficts reviewed. ? Pt to attend the Portion Distortion class ? Pt to attend the  ? Nutrition I class                      ? Nutrition II class ? Continue client-centered nutrition education by RD, as part of interdisciplinary care.  Goal(s) ? Pt to identify food quantities necessary to achieve weight loss of 6-24 lb (2.7-10.9 kg) at graduation from cardiac rehab.   Monitor and Evaluate progress toward nutrition goal with team.  Derek Mound, M.Ed, RD, LDN, CDE 02/20/2017 2:46 PM

## 2017-02-24 ENCOUNTER — Encounter (HOSPITAL_COMMUNITY)
Admission: RE | Admit: 2017-02-24 | Discharge: 2017-02-24 | Disposition: A | Discharge status: Home / Self Care | Payer: Managed Care, Other (non HMO) | Source: Ambulatory Visit | Attending: Internal Medicine | Admitting: Internal Medicine

## 2017-02-24 DIAGNOSIS — Z955 Presence of coronary angioplasty implant and graft: Secondary | ICD-10-CM

## 2017-02-27 ENCOUNTER — Encounter (HOSPITAL_COMMUNITY)
Admission: RE | Admit: 2017-02-27 | Discharge: 2017-02-27 | Disposition: A | Discharge status: Home / Self Care | Payer: Managed Care, Other (non HMO) | Source: Ambulatory Visit | Attending: Internal Medicine | Admitting: Internal Medicine

## 2017-02-27 DIAGNOSIS — Z955 Presence of coronary angioplasty implant and graft: Secondary | ICD-10-CM | POA: Diagnosis not present

## 2017-02-28 ENCOUNTER — Other Ambulatory Visit: Payer: Self-pay | Admitting: Cardiology

## 2017-03-01 ENCOUNTER — Encounter (HOSPITAL_COMMUNITY)
Admission: RE | Admit: 2017-03-01 | Discharge: 2017-03-01 | Disposition: A | Discharge status: Home / Self Care | Payer: Managed Care, Other (non HMO) | Source: Ambulatory Visit | Attending: Internal Medicine | Admitting: Internal Medicine

## 2017-03-01 DIAGNOSIS — Z955 Presence of coronary angioplasty implant and graft: Secondary | ICD-10-CM | POA: Diagnosis not present

## 2017-03-01 NOTE — Telephone Encounter (Signed)
REFILL 

## 2017-03-03 ENCOUNTER — Encounter (HOSPITAL_COMMUNITY): Payer: Managed Care, Other (non HMO)

## 2017-03-06 ENCOUNTER — Encounter (HOSPITAL_COMMUNITY)
Admission: RE | Admit: 2017-03-06 | Discharge: 2017-03-06 | Disposition: A | Discharge status: Home / Self Care | Payer: Managed Care, Other (non HMO) | Source: Ambulatory Visit | Attending: Internal Medicine | Admitting: Internal Medicine

## 2017-03-06 DIAGNOSIS — Z955 Presence of coronary angioplasty implant and graft: Secondary | ICD-10-CM | POA: Diagnosis not present

## 2017-03-07 NOTE — Progress Notes (Signed)
Cardiac Individual Treatment Plan  Patient Details  Name: Rebecca Dalton MRN: [id number] Date of Birth: 1961-04-29 Referring Provider:     CARDIAC REHAB PHASE II ORIENTATION from 12/20/2016 in [facility]  Referring Provider  Dorris Carnes, MD.      Initial Encounter Date:    CARDIAC REHAB PHASE II ORIENTATION from 12/20/2016 in [facility]  Date  12/20/16  Referring Provider  Dorris Carnes, MD.      Visit Diagnosis: Stented coronary artery  Patient's Home Medications on Admission:  Current Outpatient Prescriptions:  .  amLODipine (NORVASC) 2.5 MG tablet, Take 2.5 mg by mouth daily., Disp: , Rfl:  .  aspirin EC 81 MG tablet, Take 1 tablet (81 mg total) by mouth daily., Disp: 90 tablet, Rfl: 3 .  atorvastatin (LIPITOR) 80 MG tablet, TAKE 1 TABLET BY MOUTH DAILY AT 6 PM., Disp: 30 tablet, Rfl: 5 .  BIOTIN PO, Take 1 tablet by mouth daily., Disp: , Rfl:  .  carvedilol (COREG) 25 MG tablet, Take 25 mg by mouth 2 (two) times daily with a meal., Disp: , Rfl:  .  Cholecalciferol (VITAMIN D PO), Take 3 capsules by mouth once a week., Disp: , Rfl:  .  cyclobenzaprine (FLEXERIL) 10 MG tablet, Take 5-10 mg by mouth daily as needed for muscle spasms, Disp: , Rfl:  .  hydrochlorothiazide (MICROZIDE) 12.5 MG capsule, Take 12.5 mg by mouth daily., Disp: , Rfl:  .  ketotifen (ZADITOR) 0.025 % ophthalmic solution, Place 1 drop into both eyes as needed (for allergies)., Disp: , Rfl:  .  LORazepam (ATIVAN) 0.5 MG tablet, Take 0.5 mg by mouth daily as needed. , Disp: , Rfl:  .  MAGNESIUM PO, Take 1 tablet by mouth 4 (four) times a week., Disp: , Rfl:  .  nitroGLYCERIN (NITROSTAT) 0.4 MG SL tablet, Place 1 tablet (0.4 mg total) under the tongue every 5 (five) minutes as needed for chest pain., Disp: 25 tablet, Rfl: 4 .  omeprazole (PRILOSEC) 20 MG capsule, Take 20 mg by mouth daily as needed. , Disp: , Rfl:  .  polyethylene glycol (MIRALAX /  GLYCOLAX) packet, Take 17 g by mouth daily. , Disp: , Rfl:  .  Probiotic Product (PROBIOTIC PO), Take 1 capsule by mouth daily., Disp: , Rfl:  .  valsartan (DIOVAN) 320 MG tablet, Take 320 mg by mouth daily., Disp: , Rfl:   Past Medical History: Past Medical History:  Diagnosis Date  . Abnormal cardiovascular stress test 10/07/2016  . Anxiety   . CAD in native artery 10/07/2016  . Chronic bronchitis ([place])   . Coronary artery disease   . GERD (gastroesophageal reflux disease)   . HLD (hyperlipidemia) 10/07/2016  . HTN (hypertension) 10/07/2016  . Hypertension   . Pneumonia    "twice" (10/06/2016)  . S/P angioplasty with stent, 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/06/16 10/07/2016    Tobacco Use: History  Smoking Status  . Former Smoker  . Packs/day: 0.50  . Years: 39.00  . Quit date: 10/06/2016  Smokeless Tobacco  . Never Used    Labs: Recent Review Flowsheet Data    There is no flowsheet data to display.      Capillary Blood Glucose: No results found for: GLUCAP   Exercise Target Goals:    Exercise Program Goal: Individual exercise prescription set with THRR, safety & activity barriers. Participant demonstrates ability to understand and report RPE using BORG scale, to self-measure pulse accurately, and  to acknowledge the importance of the exercise prescription.  Exercise Prescription Goal: Starting with aerobic activity 30 plus minutes a day, 3 days per week for initial exercise prescription. Provide home exercise prescription and guidelines that participant acknowledges understanding prior to discharge.  Activity Barriers & Risk Stratification:     Activity Barriers & Cardiac Risk Stratification - 12/20/16 1227      Activity Barriers & Cardiac Risk Stratification   Activity Barriers None   Cardiac Risk Stratification Moderate      6 Minute Walk:     6 Minute Walk    Row Name 12/20/16 1028         6 Minute Walk   Phase Initial     Distance 1413 feet     Walk Time 6 minutes      # of Rest Breaks 0     MPH 2.68     METS 3.68     RPE 7     VO2 Peak 12.89     Symptoms No     Resting HR 76 bpm     Resting BP 109/72     Max Ex. HR 99 bpm     Max Ex. BP 128/70     2 Minute Post BP 124/80        Oxygen Initial Assessment:   Oxygen Re-Evaluation:   Oxygen Discharge (Final Oxygen Re-Evaluation):   Initial Exercise Prescription:     Initial Exercise Prescription - 12/20/16 1200      Date of Initial Exercise RX and Referring Provider   Date 12/20/16   Referring Provider Dorris Carnes, MD.     Treadmill   MPH 2.6   Grade 1   Minutes 10   METs 3.35     Bike   Level 1.1   Minutes 10   METs 3.77     NuStep   Level 4   SPM 90   Minutes 10   METs 3     Prescription Details   Frequency (times per week) 3   Duration Progress to 30 minutes of continuous aerobic without signs/symptoms of physical distress     Intensity   THRR 40-80% of Max Heartrate 65-131   Ratings of Perceived Exertion 11-13   Perceived Dyspnea 0-4     Progression   Progression Continue to progress workloads to maintain intensity without signs/symptoms of physical distress.     Resistance Training   Training Prescription Yes   Weight 3lbs.      Perform Capillary Blood Glucose checks as needed.  Exercise Prescription Changes:     Exercise Prescription Changes    Row Name 01/10/17 1400 01/24/17 1600 02/17/17 1648 03/06/17 1600       Response to Exercise   Blood Pressure (Admit) 122/80 124/78 124/80 124/80    Blood Pressure (Exercise) 148/84 142/72 144/84 150/80    Blood Pressure (Exit) 130/86 130/84 118/78 106/78    Heart Rate (Admit) 83 bpm 83 bpm 81 bpm 83 bpm    Heart Rate (Exercise) 112 bpm 112 bpm 116 bpm 110 bpm    Heart Rate (Exit) 82 bpm 83 bpm 88 bpm 83 bpm    Rating of Perceived Exertion (Exercise) 12 12 12 12     Symptoms none none none none    Duration Progress to 45 minutes of aerobic exercise without signs/symptoms of physical distress Progress  to 45 minutes of aerobic exercise without signs/symptoms of physical distress Progress to 45 minutes of aerobic exercise without signs/symptoms of physical  distress Progress to 45 minutes of aerobic exercise without signs/symptoms of physical distress    Intensity THRR unchanged THRR unchanged THRR unchanged THRR unchanged      Progression   Progression Continue to progress workloads to maintain intensity without signs/symptoms of physical distress. Continue to progress workloads to maintain intensity without signs/symptoms of physical distress. Continue to progress workloads to maintain intensity without signs/symptoms of physical distress. Continue to progress workloads to maintain intensity without signs/symptoms of physical distress.    Average METs 3.4 4.5 5.1 5.3      Resistance Training   Training Prescription Yes Yes Yes Yes    Weight 3lbs. 4lbs 4lbs 4lbs    Reps 10-15 10-15 10-15 10-15    Time 10 Minutes 10 Minutes 10 Minutes 10 Minutes      Treadmill   MPH 2.7 2.7  -  -    Grade 2 2  -  -    Minutes 15 15  -  -    METs 3.71 3.71  -  -      Bike   Level  - 3.5 4 4     Minutes  - 15 15 15     METs  - 5.2 5.4 5.8      NuStep   Level 4  - 4 4    SPM 90  - 90 90    Minutes 15  - 15 15    METs 3  - 4.7 3.8      Home Exercise Plan   Plans to continue exercise at Home (comment) Home (comment) Home (comment)  walking Home (comment)  walking    Frequency Add 2 additional days to program exercise sessions. Add 2 additional days to program exercise sessions. Add 2 additional days to program exercise sessions. Add 2 additional days to program exercise sessions.    Initial Home Exercises Provided 01/09/17 01/09/17 01/09/17 01/09/17       Exercise Comments:     Exercise Comments    Row Name 01/10/17 1432 02/06/17 1508 03/06/17 1651       Exercise Comments Reviewed HEP with patient 01/09/17 Reviewed METs and goals. Pt is tolerating exercise very well; will continue to monitor  pt's progress and actvity levels.  Reviewed METs and goals. Pt is tolerating exercise very well; will continue to monitor pt's progress and actvity levels.         Exercise Goals and Review:     Exercise Goals    Row Name 12/20/16 1227             Exercise Goals   Increase Physical Activity Yes       Intervention Provide advice, education, support and counseling about physical activity/exercise needs.;Develop an individualized exercise prescription for aerobic and resistive training based on initial evaluation findings, risk stratification, comorbidities and participant's personal goals.       Expected Outcomes Achievement of increased cardiorespiratory fitness and enhanced flexibility, muscular endurance and strength shown through measurements of functional capacity and personal statement of participant.          Exercise Goals Re-Evaluation :     Exercise Goals Re-Evaluation    [place] Name 01/09/17 1619 02/06/17 1504 03/06/17 1651         Exercise Goal Re-Evaluation   Exercise Goals Review Increase Physical Activity;Increase Strenth and Stamina Increase Physical Activity;Increase Strenth and Stamina  -     Comments Reviewed home exercise with pt today.  Pt plans to walk 35min -1 hour for exercise,  2x/week in addition to coming to cardiac rehab. Reviewed THR, pulse, RPE, sign and symptoms, NTG use, and when to call 911 or MD.  Also discussed weather considerations and indoor options.  Pt voiced understanding. Pt stated "feeling good" and able to exercise and perform ADL's. Pt did state most activities are limited by R arm restrictions. Pt stated "feeling good" and able to exercise and perform ADL's. Pt did state most activities are limited by R arm restrictions, however mobility in UE is improving.     Expected Outcomes Pt will be compliant with HEP and improve in cardiorespiratory fitness levels Pt will be compliant with HEP and improve in cardiorespiratory fitness levels Pt will be  compliant with HEP and improve in cardiorespiratory fitness levels. Pt will also improve in UE functional mobility.         Discharge Exercise Prescription (Final Exercise Prescription Changes):     Exercise Prescription Changes - 03/06/17 1600      Response to Exercise   Blood Pressure (Admit) 124/80   Blood Pressure (Exercise) 150/80   Blood Pressure (Exit) 106/78   Heart Rate (Admit) 83 bpm   Heart Rate (Exercise) 110 bpm   Heart Rate (Exit) 83 bpm   Rating of Perceived Exertion (Exercise) 12   Symptoms none   Duration Progress to 45 minutes of aerobic exercise without signs/symptoms of physical distress   Intensity THRR unchanged     Progression   Progression Continue to progress workloads to maintain intensity without signs/symptoms of physical distress.   Average METs 5.3     Resistance Training   Training Prescription Yes   Weight 4lbs   Reps 10-15   Time 10 Minutes     Bike   Level 4   Minutes 15   METs 5.8     NuStep   Level 4   SPM 90   Minutes 15   METs 3.8     Home Exercise Plan   Plans to continue exercise at Home (comment)  walking   Frequency Add 2 additional days to program exercise sessions.   Initial Home Exercises Provided 01/09/17      Nutrition:  Target Goals: Understanding of nutrition guidelines, daily intake of sodium 1500mg , cholesterol 200mg , calories 30% from fat and 7% or less from saturated fats, daily to have 5 or more servings of fruits and vegetables.  Biometrics:     Pre Biometrics - 12/20/16 1029      Pre Biometrics   Height 5\' 4"  (1.626 m)   Weight 164 lb 0.4 oz (74.4 kg)   Waist Circumference 35.5 inches   Hip Circumference 39.75 inches   Waist to Hip Ratio 0.89 %   BMI (Calculated) 28.2   Triceps Skinfold 39 mm   % Body Fat 40.4 %   Grip Strength 28.5 kg  c/o rt elbow pain & numbness in fingers after 2nd attempt.   Flexibility 17.5 in   Single Leg Stand 30 seconds       Nutrition Therapy Plan and  Nutrition Goals:     Nutrition Therapy & Goals - 02/20/17 1445      Nutrition Therapy   Diet Therapeutic Lifestyle Changes     Personal Nutrition Goals   Nutrition Goal Wt loss of 1-2 lb/week to a wt loss goal of 6-24 lb at graduation from [place], educate and counsel regarding individualized specific dietary modifications aiming towards targeted core components such as weight,  hypertension, lipid management, diabetes, heart failure and other comorbidities.   Expected Outcomes Short Term Goal: Understand basic principles of dietary content, such as calories, fat, sodium, cholesterol and nutrients.;Long Term Goal: Adherence to prescribed nutrition plan.      Nutrition Discharge: Nutrition Scores:     Nutrition Assessments - 02/20/17 1445      MEDFICTS Scores   Pre Score 44      Nutrition Goals Re-Evaluation:   Nutrition Goals Re-Evaluation:   Nutrition Goals Discharge (Final Nutrition Goals Re-Evaluation):   Psychosocial: Target Goals: Acknowledge presence or absence of significant depression and/or stress, maximize coping skills, provide positive support system. Participant is able to verbalize types and ability to use techniques and skills needed for reducing stress and depression.  Initial Review & Psychosocial Screening:     Initial Psych Review & Screening - 12/20/16 1148      Initial Review   Current issues with Current Depression;Current Stress Concerns   Source of Stress Concerns Chronic Illness;Unable to participate in former interests or hobbies;Financial   Comments recent broken elbow with decreased functional ability in that extremity.  this has created inability to work and perform usual activities.      Family Dynamics   Good Support System? Yes  partner      Barriers   Psychosocial barriers to participate in program The patient should benefit from training in stress management and relaxation.      Screening Interventions   Interventions Yes;Encouraged to exercise;Provide feedback about the scores to participant;To provide support and resources with identified psychosocial needs   Expected Outcomes Short Term goal: Utilizing psychosocial counselor, staff and physician to assist with identification of specific Stressors or current issues interfering with healing process. Setting desired goal for each stressor or current issue identified.;Long Term Goal: Stressors or current issues are controlled or eliminated.      Quality of Life Scores:     Quality of Life - 01/27/17 1544      Quality of Life Scores   Health/Function Pre (P)  --  low QOL scores related to recent cardiac event and displeasure with care received from PCP.  pt has lost trust in PCP and is seeking new provider. list of [facility] providers given. pt will call to make appt.    GLOBAL Pre (P)  --  pt is showing some improvement in symptoms with increased exercise ability and peer support.  will continue to monitor.       PHQ-9: Recent Review Flowsheet Data    There is no flowsheet data to display.     Interpretation of Total Score  Total Score Depression Severity:  1-4 = Minimal depression, 5-9 = Mild depression, 10-14 = Moderate depression, 15-19 = Moderately severe depression, 20-27 = Severe depression   Psychosocial Evaluation and Intervention:     Psychosocial Evaluation - 01/10/17 1454      Psychosocial Evaluation & Interventions   Interventions Stress management education;Encouraged to exercise with the program and follow exercise prescription;Relaxation education   Comments pt health related anxiety is decreasing with each group exercise session. pt verbalizes desire to establish care with new PCP.  list of area PCP given to pt.     Expected Outcomes pt will demonstrate positive outlook with good coping skills    Continue Psychosocial Services  Follow up required by staff      Psychosocial  Re-Evaluation:     Psychosocial Re-Evaluation    [place] Name 01/10/17 1456 02/08/17 1703 03/06/17 1631  Psychosocial Re-Evaluation   Current issues with Current Stress Concerns;Current Anxiety/Panic Current Stress Concerns;Current Anxiety/Panic Current Stress Concerns;Current Anxiety/Panic     Comments health related stress and anxiety. health related stress and anxiety. health related stress and anxiety.     Expected Outcomes pt will exhibit positive outlook with good coping skills.  pt will exhibit positive outlook with good coping skills.  pt will exhibit positive outlook with good coping skills.      Interventions Encouraged to attend Cardiac Rehabilitation for the exercise;Stress management education;Relaxation education Encouraged to attend Cardiac Rehabilitation for the exercise;Stress management education;Relaxation education Encouraged to attend Cardiac Rehabilitation for the exercise;Stress management education;Relaxation education     Continue Psychosocial Services  Follow up required by staff Follow up required by staff Follow up required by staff     Comments  -  - recent broken elbow with decreased functional ability in that extremity.  this has created inability to work and perform usual activities.        Initial Review   Source of Stress Concerns  -  - Chronic Illness;Unable to participate in former interests or hobbies;Financial        Psychosocial Discharge (Final Psychosocial Re-Evaluation):     Psychosocial Re-Evaluation - 03/06/17 1631      Psychosocial Re-Evaluation   Current issues with Current Stress Concerns;Current Anxiety/Panic   Comments health related stress and anxiety.   Expected Outcomes pt will exhibit positive outlook with good coping skills.    Interventions Encouraged to attend Cardiac Rehabilitation for the exercise;Stress management education;Relaxation education   Continue Psychosocial Services  Follow up required by staff   Comments recent  broken elbow with decreased functional ability in that extremity.  this has created inability to work and perform usual activities.      Initial Review   Source of Stress Concerns Chronic Illness;Unable to participate in former interests or hobbies;Financial      Vocational Rehabilitation: Provide vocational rehab assistance to qualifying candidates.   Vocational Rehab Evaluation & Intervention:   Education: Education Goals: Education classes will be provided on a weekly basis, covering required topics. Participant will state understanding/return demonstration of topics presented.  Learning Barriers/Preferences:   Education Topics: Count Your Pulse:  -Group instruction provided by verbal instruction, demonstration, patient participation and written materials to support subject.  Instructors address importance of being able to find your pulse and how to count your pulse when at home without a heart monitor.  Patients get hands on experience counting their pulse with staff help and individually.   Heart Attack, Angina, and Risk Factor Modification:  -Group instruction provided by verbal instruction, video, and written materials to support subject.  Instructors address signs and symptoms of angina and heart attacks.    Also discuss risk factors for heart disease and how to make changes to improve heart health risk factors.   CARDIAC REHAB PHASE II EXERCISE from 02/27/2017 in [place]  Date  01/11/17  Instruction Review Code  2- meets goals/outcomes      Functional Fitness:  -Group instruction provided by verbal instruction, demonstration, patient participation, and written materials to support subject.  Instructors address safety measures for doing things around the house.  Discuss how to get up and down off the floor, how to pick things up properly, how to safely get out of a chair without assistance, and balance training.   Meditation and  Mindfulness:  -Group instruction provided by verbal instruction, patient participation, and written materials  to support subject.  Instructor addresses importance of mindfulness and meditation practice to help reduce stress and improve awareness.  Instructor also leads participants through a meditation exercise.    Stretching for Flexibility and Mobility:  -Group instruction provided by verbal instruction, patient participation, and written materials to support subject.  Instructors lead participants through series of stretches that are designed to increase flexibility thus improving mobility.  These stretches are additional exercise for major muscle groups that are typically performed during regular warm up and cool down.   Hands Only CPR:  -Group verbal, video, and participation provides a basic overview of AHA guidelines for community CPR. Role-play of emergencies allow participants the opportunity to practice calling for help and chest compression technique with discussion of AED use.   Hypertension: -Group verbal and written instruction that provides a basic overview of hypertension including the most recent diagnostic guidelines, risk factor reduction with self-care instructions and medication management.   CARDIAC REHAB PHASE II EXERCISE from 02/27/2017 in [place]  Date  01/27/17  Instruction Review Code  2- meets goals/outcomes       Nutrition I class: Heart Healthy Eating:  -Group instruction provided by PowerPoint slides, verbal discussion, and written materials to support subject matter. The instructor gives an explanation and review of the Therapeutic Lifestyle Changes diet recommendations, which includes a discussion on lipid goals, dietary fat, sodium, fiber, plant stanol/sterol esters, sugar, and the components of a well-balanced, healthy diet.   CARDIAC REHAB PHASE II EXERCISE from 02/27/2017 in [place]  Date   02/21/17  Educator  RD  Instruction Review Code  2- meets goals/outcomes      Nutrition II class: Lifestyle Skills:  -Group instruction provided by PowerPoint slides, verbal discussion, and written materials to support subject matter. The instructor gives an explanation and review of label reading, grocery shopping for heart health, heart healthy recipe modifications, and ways to make healthier choices when eating out.   CARDIAC REHAB PHASE II EXERCISE from 02/27/2017 in [place]  Date  02/28/17  Educator  RD  Instruction Review Code  2- meets goals/outcomes      Diabetes Question & Answer:  -Group instruction provided by PowerPoint slides, verbal discussion, and written materials to support subject matter. The instructor gives an explanation and review of diabetes co-morbidities, pre- and post-prandial blood glucose goals, pre-exercise blood glucose goals, signs, symptoms, and treatment of hypoglycemia and hyperglycemia, and foot care basics.   Diabetes Blitz:  -Group instruction provided by PowerPoint slides, verbal discussion, and written materials to support subject matter. The instructor gives an explanation and review of the physiology behind type 1 and type 2 diabetes, diabetes medications and rational behind using different medications, pre- and post-prandial blood glucose recommendations and Hemoglobin A1c goals, diabetes diet, and exercise including blood glucose guidelines for exercising safely.    Portion Distortion:  -Group instruction provided by PowerPoint slides, verbal discussion, written materials, and food models to support subject matter. The instructor gives an explanation of serving size versus portion size, changes in portions sizes over the last 20 years, and what consists of a serving from each food group.   Stress Management:  -Group instruction provided by verbal instruction, video, and written materials to support subject matter.   Instructors review role of stress in heart disease and how to cope with stress positively.     Exercising on Your Own:  -Group instruction provided by verbal instruction,  power point, and written materials to support subject.  Instructors discuss benefits of exercise, components of exercise, frequency and intensity of exercise, and end points for exercise.  Also discuss use of nitroglycerin and activating EMS.  Review options of places to exercise outside of rehab.  Review guidelines for sex with heart disease.   Cardiac Drugs I:  -Group instruction provided by verbal instruction and written materials to support subject.  Instructor reviews cardiac drug classes: antiplatelets, anticoagulants, beta blockers, and statins.  Instructor discusses reasons, side effects, and lifestyle considerations for each drug class.   Cardiac Drugs II:  -Group instruction provided by verbal instruction and written materials to support subject.  Instructor reviews cardiac drug classes: angiotensin converting enzyme inhibitors (ACE-I), angiotensin II receptor blockers (ARBs), nitrates, and calcium channel blockers.  Instructor discusses reasons, side effects, and lifestyle considerations for each drug class.   CARDIAC REHAB PHASE II EXERCISE from 02/27/2017 in [place]  Date  12/28/16  Instruction Review Code  2- meets goals/outcomes      Anatomy and Physiology of the Circulatory System:  Group verbal and written instruction and models provide basic cardiac anatomy and physiology, with the coronary electrical and arterial systems. Review of: AMI, Angina, Valve disease, Heart Failure, Peripheral Artery Disease, Cardiac Arrhythmia, Pacemakers, and the ICD.   Other Education:  -Group or individual verbal, written, or video instructions that support the educational goals of the cardiac rehab program.   Knowledge Questionnaire Score:     Knowledge Questionnaire Score - 12/20/16  1109      Knowledge Questionnaire Score   Pre Score 26/28      Core Components/Risk Factors/Patient Goals at Admission:     Personal Goals and Risk Factors at Admission - 12/20/16 1137      Core Components/Risk Factors/Patient Goals on Admission    Weight Management Yes;Weight Loss   Intervention Weight Management: Develop a combined nutrition and exercise program designed to reach desired caloric intake, while maintaining appropriate intake of nutrient and fiber, sodium and fats, and appropriate energy expenditure required for the weight goal.;Weight Management: Provide education and appropriate resources to help participant work on and attain dietary goals.   Admit Weight 164 lb 0.4 oz (74.4 kg)   Goal Weight: Short Term 150 lb (68 kg)   Expected Outcomes Short Term: Continue to assess and modify interventions until short term weight is achieved;Long Term: Adherence to nutrition and physical activity/exercise program aimed toward attainment of established weight goal;Weight Loss: Understanding of general recommendations for a balanced deficit meal plan, which promotes 1-2 lb weight loss per week and includes a negative energy balance of [phone] kcal/d   Tobacco Cessation Yes   Intervention Assist the participant in steps to quit. Provide individualized education and counseling about committing to Tobacco Cessation, relapse prevention, and pharmacological support that can be provided by physician.   Expected Outcomes Long Term: Complete abstinence from all tobacco products for at least 12 months from quit date.   Hypertension Yes   Intervention Provide education on lifestyle modifcations including regular physical activity/exercise, weight management, moderate sodium restriction and increased consumption of fresh fruit, vegetables, and low fat dairy, alcohol moderation, and smoking cessation.;Monitor prescription use compliance.   Expected Outcomes Short Term: Continued assessment and  intervention until BP is < 140/9mm HG in hypertensive participants. < 130/34mm HG in hypertensive participants with diabetes, heart failure or chronic kidney disease.;Long Term: Maintenance of blood pressure at goal levels.   Lipids Yes  Intervention Provide education and support for participant on nutrition & aerobic/resistive exercise along with prescribed medications to achieve LDL 70mg , HDL >40mg .   Expected Outcomes Short Term: Participant states understanding of desired cholesterol values and is compliant with medications prescribed. Participant is following exercise prescription and nutrition guidelines.;Long Term: Cholesterol controlled with medications as prescribed, with individualized exercise RX and with personalized nutrition plan. Value goals: LDL < 70mg , HDL > 40 mg.   Stress Yes   Intervention Offer individual and/or small group education and counseling on adjustment to heart disease, stress management and health-related lifestyle change. Teach and support self-help strategies.;Refer participants experiencing significant psychosocial distress to appropriate mental health specialists for further evaluation and treatment. When possible, include family members and significant others in education/counseling sessions.   Expected Outcomes Short Term: Participant demonstrates changes in health-related behavior, relaxation and other stress management skills, ability to obtain effective social support, and compliance with psychotropic medications if prescribed.;Long Term: Emotional wellbeing is indicated by absence of clinically significant psychosocial distress or social isolation.   Personal Goal Other Yes   Personal Goal Short Term:  To develop a walking habit.  Long Term:  Weight lose with goal weight <150lb, to improve overall health.    Intervention provide home exercise plan.  provide exercise and nutritional educational opportunities to promote weight loss, heart healthy lifestyle and  improved overall health.   Expected Outcomes pt will activitly participate in home exercise plan. pt will participate in cardiac rehab exercise, education and nutrition opportunties to decrease CAD risk factors and improve overall health.      Core Components/Risk Factors/Patient Goals Review:      Goals and Risk Factor Review    Row Name 01/06/17 1108 02/08/17 1701 03/06/17 1630         Core Components/Risk Factors/Patient Goals Review   Personal Goals Review Weight Management/Obesity;Tobacco Cessation;Hypertension;Lipids;Stress Weight Management/Obesity;Tobacco Cessation;Hypertension;Lipids;Stress Weight Management/Obesity;Tobacco Cessation;Hypertension;Lipids;Stress     Review pt with multiple risk factors demonstrates eagerness to participate in CR activities.  pt counseled on smoking cessation and continued efforts to decrease overall risk factors.  pt has been able to walk at home including hills.  pt with multiple risk factors demonstrates eagerness to participate in CR activities.  pt counseled on smoking cessation and continued efforts to decrease overall risk factors.  pt has been able to walk at home including hills.  pt is pleased she was able to enjoying spending recent weekend in [place] without difficulty and mow her lawn without difficulty. pt reminded of heat/humidity exercise precautions. understanding verbalized.  pt with multiple risk factors demonstrates eagerness to participate in CR activities.  pt counseled on smoking cessation and continued efforts to decrease overall risk factors.  pt very pleased to feel well enough to resume her usual activities.      Expected Outcomes t will participate in CR exercise, nutrition and lifestyle education opportunities to decrease overall CAD risk factors.  t will participate in CR exercise, nutrition and lifestyle education opportunities to decrease overall CAD risk factors.  pt  will participate in CR exercise, nutrition and lifestyle  education opportunities to decrease overall CAD risk factors.         Core Components/Risk Factors/Patient Goals at Discharge (Final Review):      Goals and Risk Factor Review - 03/06/17 1630      Core Components/Risk Factors/Patient Goals Review   Personal Goals Review Weight Management/Obesity;Tobacco Cessation;Hypertension;Lipids;Stress   Review pt with multiple risk factors demonstrates eagerness to participate in CR activities.  pt counseled on smoking cessation and continued efforts to decrease overall risk factors.  pt very pleased to feel well enough to resume her usual activities.    Expected Outcomes pt  will participate in CR exercise, nutrition and lifestyle education opportunities to decrease overall CAD risk factors.       ITP Comments:     ITP Comments    Row Name 12/20/16 0859 02/07/17 1644 02/08/17 1701 03/06/17 1630     ITP Comments Medical Director:  Dr. Fransico Him  Medical Director:  Dr. Fransico Him  Medical Director:  Dr. Fransico Him  Medical Director:  Dr. Fransico Him        Comments: Pt is making expected progress toward personal goals after completing 27 sessions. Recommend continued exercise and life style modification education including  stress management and relaxation techniques to decrease cardiac risk profile.

## 2017-03-08 ENCOUNTER — Encounter (HOSPITAL_COMMUNITY)
Admission: RE | Admit: 2017-03-08 | Discharge: 2017-03-08 | Disposition: A | Discharge status: Home / Self Care | Payer: Managed Care, Other (non HMO) | Source: Ambulatory Visit | Attending: Internal Medicine | Admitting: Internal Medicine

## 2017-03-08 DIAGNOSIS — Z955 Presence of coronary angioplasty implant and graft: Secondary | ICD-10-CM | POA: Diagnosis not present

## 2017-03-10 ENCOUNTER — Encounter (HOSPITAL_COMMUNITY)
Admission: RE | Admit: 2017-03-10 | Discharge: 2017-03-10 | Disposition: A | Discharge status: Home / Self Care | Payer: Managed Care, Other (non HMO) | Source: Ambulatory Visit | Attending: Internal Medicine | Admitting: Internal Medicine

## 2017-03-10 DIAGNOSIS — Z955 Presence of coronary angioplasty implant and graft: Secondary | ICD-10-CM

## 2017-03-13 ENCOUNTER — Encounter (HOSPITAL_COMMUNITY)
Admission: RE | Admit: 2017-03-13 | Discharge: 2017-03-13 | Disposition: A | Discharge status: Home / Self Care | Payer: Managed Care, Other (non HMO) | Source: Ambulatory Visit | Attending: Internal Medicine | Admitting: Internal Medicine

## 2017-03-13 DIAGNOSIS — Z955 Presence of coronary angioplasty implant and graft: Secondary | ICD-10-CM | POA: Diagnosis not present

## 2017-03-15 ENCOUNTER — Encounter (HOSPITAL_COMMUNITY)
Admission: RE | Admit: 2017-03-15 | Discharge: 2017-03-15 | Disposition: A | Discharge status: Home / Self Care | Payer: Managed Care, Other (non HMO) | Source: Ambulatory Visit | Attending: Internal Medicine | Admitting: Internal Medicine

## 2017-03-15 DIAGNOSIS — Z955 Presence of coronary angioplasty implant and graft: Secondary | ICD-10-CM | POA: Diagnosis not present

## 2017-03-17 ENCOUNTER — Encounter (HOSPITAL_COMMUNITY)
Admission: RE | Admit: 2017-03-17 | Discharge: 2017-03-17 | Disposition: A | Discharge status: Home / Self Care | Payer: Managed Care, Other (non HMO) | Source: Ambulatory Visit | Attending: Internal Medicine | Admitting: Internal Medicine

## 2017-03-17 DIAGNOSIS — Z955 Presence of coronary angioplasty implant and graft: Secondary | ICD-10-CM

## 2017-03-20 ENCOUNTER — Encounter (HOSPITAL_COMMUNITY)
Admission: RE | Admit: 2017-03-20 | Discharge: 2017-03-20 | Disposition: A | Discharge status: Home / Self Care | Payer: Managed Care, Other (non HMO) | Source: Ambulatory Visit | Attending: Internal Medicine | Admitting: Internal Medicine

## 2017-03-20 DIAGNOSIS — Z955 Presence of coronary angioplasty implant and graft: Secondary | ICD-10-CM

## 2017-03-22 ENCOUNTER — Encounter (HOSPITAL_COMMUNITY)
Admission: RE | Admit: 2017-03-22 | Discharge: 2017-03-22 | Disposition: A | Discharge status: Home / Self Care | Payer: Managed Care, Other (non HMO) | Source: Ambulatory Visit | Attending: Internal Medicine | Admitting: Internal Medicine

## 2017-03-22 DIAGNOSIS — Z955 Presence of coronary angioplasty implant and graft: Secondary | ICD-10-CM | POA: Insufficient documentation

## 2017-03-24 ENCOUNTER — Encounter (HOSPITAL_COMMUNITY)
Admission: RE | Admit: 2017-03-24 | Discharge: 2017-03-24 | Disposition: A | Discharge status: Home / Self Care | Payer: Managed Care, Other (non HMO) | Source: Ambulatory Visit | Attending: Internal Medicine | Admitting: Internal Medicine

## 2017-03-24 DIAGNOSIS — Z955 Presence of coronary angioplasty implant and graft: Secondary | ICD-10-CM | POA: Diagnosis not present

## 2017-03-27 ENCOUNTER — Encounter (HOSPITAL_COMMUNITY)
Admission: RE | Admit: 2017-03-27 | Discharge: 2017-03-27 | Disposition: A | Discharge status: Home / Self Care | Payer: Managed Care, Other (non HMO) | Source: Ambulatory Visit | Attending: Internal Medicine | Admitting: Internal Medicine

## 2017-03-27 ENCOUNTER — Encounter (HOSPITAL_COMMUNITY): Payer: Self-pay

## 2017-03-27 DIAGNOSIS — Z955 Presence of coronary angioplasty implant and graft: Secondary | ICD-10-CM | POA: Diagnosis not present

## 2017-03-29 ENCOUNTER — Encounter (HOSPITAL_COMMUNITY): Payer: Managed Care, Other (non HMO)

## 2017-03-31 ENCOUNTER — Encounter (HOSPITAL_COMMUNITY): Payer: Managed Care, Other (non HMO)

## 2017-04-03 ENCOUNTER — Encounter (HOSPITAL_COMMUNITY): Payer: Managed Care, Other (non HMO)

## 2017-04-05 ENCOUNTER — Encounter (HOSPITAL_COMMUNITY): Payer: Managed Care, Other (non HMO)

## 2017-04-07 ENCOUNTER — Encounter (HOSPITAL_COMMUNITY): Payer: Managed Care, Other (non HMO)

## 2017-04-10 ENCOUNTER — Encounter (HOSPITAL_COMMUNITY): Payer: Managed Care, Other (non HMO)

## 2017-04-12 ENCOUNTER — Encounter (HOSPITAL_COMMUNITY): Payer: Managed Care, Other (non HMO)

## 2017-04-14 ENCOUNTER — Encounter (HOSPITAL_COMMUNITY): Payer: Managed Care, Other (non HMO)

## 2017-04-17 ENCOUNTER — Encounter (HOSPITAL_COMMUNITY): Payer: Managed Care, Other (non HMO)

## 2017-04-18 NOTE — Progress Notes (Signed)
Discharge Progress Report  Patient Details  Name: Rebecca Dalton MRN: [id number] Date of Birth: 01/20/1961 Referring Provider:     CARDIAC REHAB PHASE II ORIENTATION from 12/20/2016 in [place]  Referring Provider  Dorris Carnes, MD.       Number of Visits: 36   Reason for Discharge:  Patient reached a stable level of exercise.  Smoking History:  History  Smoking Status  . Former Smoker  . Packs/day: 0.50  . Years: 39.00  . Quit date: 10/06/2016  Smokeless Tobacco  . Never Used    Diagnosis:  Stented coronary artery  ADL UCSD:   Initial Exercise Prescription:     Initial Exercise Prescription - 12/20/16 1200      Date of Initial Exercise RX and Referring Provider   Date 12/20/16   Referring Provider Dorris Carnes, MD.     Treadmill   MPH 2.6   Grade 1   Minutes 10   METs 3.35     Bike   Level 1.1   Minutes 10   METs 3.77     NuStep   Level 4   SPM 90   Minutes 10   METs 3     Prescription Details   Frequency (times per week) 3   Duration Progress to 30 minutes of continuous aerobic without signs/symptoms of physical distress     Intensity   THRR 40-80% of Max Heartrate 65-131   Ratings of Perceived Exertion 11-13   Perceived Dyspnea 0-4     Progression   Progression Continue to progress workloads to maintain intensity without signs/symptoms of physical distress.     Resistance Training   Training Prescription Yes   Weight 3lbs.      Discharge Exercise Prescription (Final Exercise Prescription Changes):     Exercise Prescription Changes - 03/27/17 1455      Response to Exercise   Blood Pressure (Admit) 114/74   Blood Pressure (Exercise) 174/80   Blood Pressure (Exit) 120/78   Heart Rate (Admit) 85 bpm   Heart Rate (Exercise) 112 bpm   Heart Rate (Exit) 82 bpm   Rating of Perceived Exertion (Exercise) 12   Symptoms none   Duration Continue with 45 min of aerobic exercise without signs/symptoms of  physical distress.   Intensity THRR unchanged     Progression   Progression Continue to progress workloads to maintain intensity without signs/symptoms of physical distress.   Average METs 4.7     Resistance Training   Training Prescription Yes   Weight 4lbs   Reps 10-15   Time 10 Minutes     Treadmill   MPH 3   Grade 3   Minutes 15   METs 4.54     NuStep   Level 4   SPM 100   Minutes 15   METs 4.9     Home Exercise Plan   Plans to continue exercise at Home (comment)  walking   Frequency Add 2 additional days to program exercise sessions.   Initial Home Exercises Provided 01/09/17      Functional Capacity:     6 Minute Walk    Row Name 12/20/16 1028 03/15/17 1452       6 Minute Walk   Phase Initial Discharge    Distance 1413 feet 2121 feet    Distance % Change  - 50.11 %    Walk Time 6 minutes 6 minutes    # of Rest Breaks 0 0  MPH 2.68 4.02    METS 3.68 5.4    RPE 7 11    VO2 Peak 12.89 18.93    Symptoms No No    Resting HR 76 bpm 91 bpm    Resting BP 109/72 138/84    Max Ex. HR 99 bpm 128 bpm    Max Ex. BP 128/70 152/80    2 Minute Post BP 124/80  -       Psychological, QOL, Others - Outcomes: PHQ 2/9: Depression screen PHQ 2/9 03/27/2017  Decreased Interest 0  Down, Depressed, Hopeless 0  PHQ - 2 Score 0    Quality of Life:     Quality of Life - 03/24/17 1135      Quality of Life Scores   Health/Function Pre 17.89 %   Health/Function Post 25.7 %   Health/Function % Change 43.66 %   Socioeconomic Pre 20.25 %   Socioeconomic Post 24.64 %   Socioeconomic % Change  21.68 %   Psych/Spiritual Pre 18.07 %   Psych/Spiritual Post 25 %   Psych/Spiritual % Change 38.35 %   Family Pre 18.38 %   Family Post 24 %   Family % Change 30.58 %   GLOBAL Pre 18.45 %   GLOBAL Post 25.13 %   GLOBAL % Change 36.21 %      Personal Goals: Goals established at orientation with interventions provided to work toward goal.     Personal Goals and  Risk Factors at Admission - 12/20/16 1137      Core Components/Risk Factors/Patient Goals on Admission    Weight Management Yes;Weight Loss   Intervention Weight Management: Develop a combined nutrition and exercise program designed to reach desired caloric intake, while maintaining appropriate intake of nutrient and fiber, sodium and fats, and appropriate energy expenditure required for the weight goal.;Weight Management: Provide education and appropriate resources to help participant work on and attain dietary goals.   Admit Weight 164 lb 0.4 oz (74.4 kg)   Goal Weight: Short Term 150 lb (68 kg)   Expected Outcomes Short Term: Continue to assess and modify interventions until short term weight is achieved;Long Term: Adherence to nutrition and physical activity/exercise program aimed toward attainment of established weight goal;Weight Loss: Understanding of general recommendations for a balanced deficit meal plan, which promotes 1-2 lb weight loss per week and includes a negative energy balance of [phone] kcal/d   Tobacco Cessation Yes   Intervention Assist the participant in steps to quit. Provide individualized education and counseling about committing to Tobacco Cessation, relapse prevention, and pharmacological support that can be provided by physician.   Expected Outcomes Long Term: Complete abstinence from all tobacco products for at least 12 months from quit date.   Hypertension Yes   Intervention Provide education on lifestyle modifcations including regular physical activity/exercise, weight management, moderate sodium restriction and increased consumption of fresh fruit, vegetables, and low fat dairy, alcohol moderation, and smoking cessation.;Monitor prescription use compliance.   Expected Outcomes Short Term: Continued assessment and intervention until BP is < 140/28mm HG in hypertensive participants. < 130/67mm HG in hypertensive participants with diabetes, heart failure or chronic kidney  disease.;Long Term: Maintenance of blood pressure at goal levels.   Lipids Yes   Intervention Provide education and support for participant on nutrition & aerobic/resistive exercise along with prescribed medications to achieve LDL 70mg , HDL >40mg .   Expected Outcomes Short Term: Participant states understanding of desired cholesterol values and is compliant with medications prescribed. Participant is following exercise  prescription and nutrition guidelines.;Long Term: Cholesterol controlled with medications as prescribed, with individualized exercise RX and with personalized nutrition plan. Value goals: LDL < 70mg , HDL > 40 mg.   Stress Yes   Intervention Offer individual and/or small group education and counseling on adjustment to heart disease, stress management and health-related lifestyle change. Teach and support self-help strategies.;Refer participants experiencing significant psychosocial distress to appropriate mental health specialists for further evaluation and treatment. When possible, include family members and significant others in education/counseling sessions.   Expected Outcomes Short Term: Participant demonstrates changes in health-related behavior, relaxation and other stress management skills, ability to obtain effective social support, and compliance with psychotropic medications if prescribed.;Long Term: Emotional wellbeing is indicated by absence of clinically significant psychosocial distress or social isolation.   Personal Goal Other Yes   Personal Goal Short Term:  To develop a walking habit.  Long Term:  Weight lose with goal weight <150lb, to improve overall health.    Intervention provide home exercise plan.  provide exercise and nutritional educational opportunities to promote weight loss, heart healthy lifestyle and improved overall health.   Expected Outcomes pt will activitly participate in home exercise plan. pt will participate in cardiac rehab exercise, education and  nutrition opportunties to decrease CAD risk factors and improve overall health.       Personal Goals Discharge:     Goals and Risk Factor Review    Row Name 01/06/17 1108 02/08/17 1701 03/06/17 1630 03/27/17 1616       Core Components/Risk Factors/Patient Goals Review   Personal Goals Review Weight Management/Obesity;Tobacco Cessation;Hypertension;Lipids;Stress Weight Management/Obesity;Tobacco Cessation;Hypertension;Lipids;Stress Weight Management/Obesity;Tobacco Cessation;Hypertension;Lipids;Stress Weight Management/Obesity;Tobacco Cessation;Hypertension;Lipids;Stress    Review pt with multiple risk factors demonstrates eagerness to participate in CR activities.  pt counseled on smoking cessation and continued efforts to decrease overall risk factors.  pt has been able to walk at home including hills.  pt with multiple risk factors demonstrates eagerness to participate in CR activities.  pt counseled on smoking cessation and continued efforts to decrease overall risk factors.  pt has been able to walk at home including hills.  pt is pleased she was able to enjoying spending recent weekend in [place] without difficulty and mow her lawn without difficulty. pt reminded of heat/humidity exercise precautions. understanding verbalized.  pt with multiple risk factors demonstrates eagerness to participate in CR activities.  pt counseled on smoking cessation and continued efforts to decrease overall risk factors.  pt very pleased to feel well enough to resume her usual activities.  pt completed cardiac rehab program wtih 36 sessions.  pt with multiple risk factors demonstrates continued eagerness to make risk factor modifications. pt counseled on smoking cessation. although pt has not quit completely she has drastically cut back. pt is planning to continue exercising walking and using friends home equipment.     Expected Outcomes t will participate in CR exercise, nutrition and lifestyle education  opportunities to decrease overall CAD risk factors.  t will participate in CR exercise, nutrition and lifestyle education opportunities to decrease overall CAD risk factors.  pt  will participate in CR exercise, nutrition and lifestyle education opportunities to decrease overall CAD risk factors.  pt will continue making lifestyle modificaitons including exercise, nutrition and smoking cessation       Exercise Goals and Review:     Exercise Goals    Row Name 12/20/16 1227             Exercise Goals   Increase Physical Activity Yes  Intervention Provide advice, education, support and counseling about physical activity/exercise needs.;Develop an individualized exercise prescription for aerobic and resistive training based on initial evaluation findings, risk stratification, comorbidities and participant's personal goals.       Expected Outcomes Achievement of increased cardiorespiratory fitness and enhanced flexibility, muscular endurance and strength shown through measurements of functional capacity and personal statement of participant.          Nutrition & Weight - Outcomes:     Pre Biometrics - 12/20/16 1029      Pre Biometrics   Height 5\' 4"  (1.626 m)   Weight 164 lb 0.4 oz (74.4 kg)   Waist Circumference 35.5 inches   Hip Circumference 39.75 inches   Waist to Hip Ratio 0.89 %   BMI (Calculated) 28.2   Triceps Skinfold 39 mm   % Body Fat 40.4 %   Grip Strength 28.5 kg  c/o rt elbow pain & numbness in fingers after 2nd attempt.   Flexibility 17.5 in   Single Leg Stand 30 seconds         Post Biometrics - 03/15/17 1453       Post  Biometrics   Waist Circumference 37.5 inches   Hip Circumference 40 inches   Waist to Hip Ratio 0.94 %   Triceps Skinfold 34 mm   % Body Fat 40.5 %   Grip Strength 36 kg   Flexibility 19 in   Single Leg Stand 30 seconds      Nutrition:     Nutrition Therapy & Goals - 02/20/17 1445      Nutrition Therapy   Diet Therapeutic  Lifestyle Changes     Personal Nutrition Goals   Nutrition Goal Wt loss of 1-2 lb/week to a wt loss goal of 6-24 lb at graduation from [place], educate and counsel regarding individualized specific dietary modifications aiming towards targeted core components such as weight, hypertension, lipid management, diabetes, heart failure and other comorbidities.   Expected Outcomes Short Term Goal: Understand basic principles of dietary content, such as calories, fat, sodium, cholesterol and nutrients.;Long Term Goal: Adherence to prescribed nutrition plan.      Nutrition Discharge:     Nutrition Assessments - 04/14/17 1221      MEDFICTS Scores   Pre Score 44   Post Score 12   Score Difference -32      Education Questionnaire Score:     Knowledge Questionnaire Score - 03/24/17 1133      Knowledge Questionnaire Score   Post Score 26/28      Goals reviewed with patient; copy given to patient.

## 2017-04-18 NOTE — Addendum Note (Signed)
Encounter addended by: Lowell Guitar, RN on: 04/18/2017  7:07 AM<BR>    Actions taken: Sign clinical note, Episode resolved

## 2017-06-22 ENCOUNTER — Ambulatory Visit (INDEPENDENT_AMBULATORY_CARE_PROVIDER_SITE_OTHER): Payer: Managed Care, Other (non HMO) | Admitting: Internal Medicine

## 2017-06-22 ENCOUNTER — Encounter: Payer: Self-pay | Admitting: Internal Medicine

## 2017-06-22 VITALS — BP 102/66 | HR 81 | Ht 63.0 in | Wt 163.8 lb

## 2017-06-22 DIAGNOSIS — I2510 Atherosclerotic heart disease of native coronary artery without angina pectoris: Secondary | ICD-10-CM

## 2017-06-22 DIAGNOSIS — E782 Mixed hyperlipidemia: Secondary | ICD-10-CM | POA: Diagnosis not present

## 2017-06-22 DIAGNOSIS — I10 Essential (primary) hypertension: Secondary | ICD-10-CM | POA: Diagnosis not present

## 2017-06-22 LAB — CBC
Hematocrit: 39.9 % (ref 34.0–46.6)
Hemoglobin: 14.1 g/dL (ref 11.1–15.9)
MCH: 33.8 pg — ABNORMAL HIGH (ref 26.6–33.0)
MCHC: 35.3 g/dL (ref 31.5–35.7)
MCV: 96 fL (ref 79–97)
Platelets: 138 10*3/uL — ABNORMAL LOW (ref 150–379)
RBC: 4.17 x10E6/uL (ref 3.77–5.28)
RDW: 13.4 % (ref 12.3–15.4)
WBC: 5.5 10*3/uL (ref 3.4–10.8)

## 2017-06-22 LAB — BASIC METABOLIC PANEL
BUN/Creatinine Ratio: 17 (ref 9–23)
BUN: 11 mg/dL (ref 6–24)
CO2: 25 mmol/L (ref 20–29)
Calcium: 9.4 mg/dL (ref 8.7–10.2)
Chloride: 98 mmol/L (ref 96–106)
Creatinine, Ser: 0.63 mg/dL (ref 0.57–1.00)
GFR calc Af Amer: 115 mL/min/{1.73_m2} (ref >59)
GFR calc non Af Amer: 100 mL/min/{1.73_m2} (ref >59)
Glucose: 95 mg/dL (ref 65–99)
Potassium: 4.1 mmol/L (ref 3.5–5.2)
Sodium: 140 mmol/L (ref 134–144)

## 2017-06-22 LAB — LIPID PANEL
Chol/HDL Ratio: 2.6 ratio (ref 0.0–4.4)
Cholesterol, Total: 143 mg/dL (ref 100–199)
HDL: 54 mg/dL (ref >39)
LDL Calculated: 60 mg/dL (ref 0–99)
Triglycerides: 143 mg/dL (ref 0–149)
VLDL Cholesterol Cal: 29 mg/dL (ref 5–40)

## 2017-06-22 NOTE — Patient Instructions (Signed)
Medication Instructions:  Your physician recommends that you continue on your current medications as directed. Please refer to the Current Medication list given to you today.   Labwork: TODAY BMET, CBC, LIPID PROFILE  Testing/Procedures: NONE ORDERED TODAY  Follow-Up: 11/2017 WITH DR. ROSS; OUR OFFICE WILL SEND OUT A REMINDER LETTER TO YOU 2 MONTHS EARLIER   Any Other Special Instructions Will Be Listed Below (If Applicable).     If you need a refill on your cardiac medications before your next appointment, please call your pharmacy.

## 2017-06-22 NOTE — Progress Notes (Signed)
Cardiology Office Note   Date:  06/22/2017   ID:  Rebecca Dalton, DOB 1961-01-08, MRN [id number]  PCP:  Harlan Stains, MD  Cardiologist:   Dorris Carnes, MD   F/U of HTN and CAD     History of Present Illness: Rebecca Dalton is a 56 y.o. female with a history of HTN, and CAD   I saw her in march 2018  Had stress test on Feb 15 that was high risk study  Ischemia in LAD distribution    Went on to have L heart cath   This showed 99% LAD lesion  Very mild dz elsewhere  Normal LV function  She underwent PTCA/DES to LAD   She was initially placed on Brilinta  More SOB      Switched to Effient  Breathing is better  Since seen she says her breathing is still good  She denies CP     Current Meds  Medication Sig  . amLODipine (NORVASC) 2.5 MG tablet Take 2.5 mg by mouth daily.  Marland Kitchen aspirin EC 81 MG tablet Take 1 tablet (81 mg total) by mouth daily.  Marland Kitchen atorvastatin (LIPITOR) 80 MG tablet TAKE 1 TABLET BY MOUTH DAILY AT 6 PM.  . BIOTIN PO Take 1 tablet by mouth daily.  . carvedilol (COREG) 25 MG tablet Take 25 mg by mouth 2 (two) times daily with a meal.  . Cholecalciferol (VITAMIN D PO) Take 3 capsules by mouth once a week.  . cyclobenzaprine (FLEXERIL) 10 MG tablet Take 5-10 mg by mouth daily as needed for muscle spasms  . hydrochlorothiazide (MICROZIDE) 12.5 MG capsule Take 12.5 mg by mouth daily.  Marland Kitchen ketotifen (ZADITOR) 0.025 % ophthalmic solution Place 1 drop into both eyes as needed (for allergies).  . LORazepam (ATIVAN) 0.5 MG tablet Take 0.5 mg by mouth daily as needed.   Marland Kitchen MAGNESIUM PO Take 1 tablet by mouth 4 (four) times a week.  . nitroGLYCERIN (NITROSTAT) 0.4 MG SL tablet Place 1 tablet (0.4 mg total) under the tongue every 5 (five) minutes as needed for chest pain.  Marland Kitchen omeprazole (PRILOSEC) 20 MG capsule Take 20 mg by mouth daily as needed.   . polyethylene glycol (MIRALAX / GLYCOLAX) packet Take 17 g by mouth daily.   . Probiotic Product (PROBIOTIC PO) Take 1 capsule by mouth  daily.  . valsartan (DIOVAN) 320 MG tablet Take 320 mg by mouth daily.     Allergies:   Penicillin g; Penicillins; Betamethasone; Codeine; Sulfa antibiotics; Sulfamethoxazole; Sulfasalazine; and Latex   Past Medical History:  Diagnosis Date  . Abnormal cardiovascular stress test 10/07/2016  . Anxiety   . CAD in native artery 10/07/2016  . Chronic bronchitis ([place])   . Coronary artery disease   . GERD (gastroesophageal reflux disease)   . HLD (hyperlipidemia) 10/07/2016  . HTN (hypertension) 10/07/2016  . Hypertension   . Pneumonia    "twice" (10/06/2016)  . S/P angioplasty with stent, 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/06/16 10/07/2016    Past Surgical History:  Procedure Laterality Date  . ANTERIOR CERVICAL DECOMP/DISCECTOMY FUSION  ~ 2005   C6-7  . BACK SURGERY    . CORONARY ANGIOPLASTY WITH STENT PLACEMENT  10/06/2016  . CORONARY STENT INTERVENTION N/A 10/06/2016   Procedure: Coronary Stent Intervention;  Surgeon: Burnell Blanks, MD;  Location: [facility];  Service: Cardiovascular;  Laterality: N/A;  . ELBOW ARTHROSCOPY Right 05/03/2016   Procedure: RIGHT ARTHROSCOPY ELBOW WITH REMOVAL FOREIGN BODIES;  Surgeon: Leanora Cover,  MD;  Location: [facility];  Service: Orthopedics;  Laterality: Right;  . LEFT HEART CATH AND CORONARY ANGIOGRAPHY N/A 10/06/2016   Procedure: Left Heart Cath and Coronary Angiography;  Surgeon: Burnell Blanks, MD;  Location: [facility];  Service: Cardiovascular;  Laterality: N/A;  . REFRACTIVE SURGERY Bilateral   . TONSILLECTOMY       Social History:  The patient  reports that she has been smoking.  She has a 19.50 pack-year smoking history. She has never used smokeless tobacco. She reports that she drinks about 12.6 oz of alcohol per week . She reports that she does not use drugs.   Family History:  The patient's family history includes Diabetes in her sister; Heart attack (age of onset: 60) in her sister; Heart attack (age of onset:  46) in her maternal grandmother; Heart attack (age of onset: 86) in her brother; Heart attack (age of onset: 70) in her father; Heart disease in her father.    ROS:  Please see the history of present illness. All other systems are reviewed and  Negative to the above problem except as noted.    PHYSICAL EXAM: VS:  BP 102/66   Pulse 81   Ht 5\' 3"  (1.6 m)   Wt 163 lb 12.8 oz (74.3 kg)   SpO2 98%   BMI 29.02 kg/m   GEN: Well nourished, well developed, in no acute distress  HEENT: normal  Neck: JVP normal  , carotid bruits, or masses Cardiac: RRR; no murmurs, rubs, or gallops,no edema  Respiratory:  CTA GI: soft, nontender, nondistended, + BS  No hepatomegaly  MS: no deformity Moving all extremities   Skin: warm and dry, no rash Neuro:  Strength and sensation are intact Psych: euthymic mood, full affect   EKG:  EKG is not ordered     Lipid Panel No results found for: CHOL, TRIG, HDL, CHOLHDL, VLDL, LDLCALC, LDLDIRECT    Wt Readings from Last 3 Encounters:  06/22/17 163 lb 12.8 oz (74.3 kg)  02/10/17 165 lb 12.8 oz (75.2 kg)  12/20/16 164 lb 0.4 oz (74.4 kg)      ASSESSMENT AND PLAN: 1  CAD  S/p intervention  Continues to do well    F/U in April   Check CBC and BMET   2  HL Continue current statin  Check lipds     4  HTN  BP is good      Current medicines are reviewed at length with the patient today.  The patient does not have concerns regarding medicines.  Signed, Dorris Carnes, MD  06/22/2017 10:23 AM    [address], [place], [place]  [address] Phone: [phone]; Fax: [phone]

## 2017-06-22 NOTE — Progress Notes (Signed)
.  li

## 2017-09-01 ENCOUNTER — Other Ambulatory Visit: Payer: Self-pay

## 2017-09-01 MED ORDER — ATORVASTATIN CALCIUM 80 MG PO TABS: ORAL_TABLET | ORAL | 10 refills | Status: DC

## 2017-09-06 ENCOUNTER — Other Ambulatory Visit: Payer: Self-pay | Admitting: Internal Medicine

## 2017-09-16 ENCOUNTER — Other Ambulatory Visit: Payer: Self-pay | Admitting: Internal Medicine

## 2017-09-19 ENCOUNTER — Telehealth: Payer: Self-pay | Admitting: Internal Medicine

## 2017-09-19 MED ORDER — PRASUGREL HCL 10 MG PO TABS: 10.0000 mg | ORAL_TABLET | Freq: Every day | ORAL | 3 refills | Status: DC

## 2017-09-19 NOTE — Addendum Note (Signed)
Addended by: Derl Barrow on: 09/19/2017 04:51 PM   Modules accepted: Orders

## 2017-09-19 NOTE — Telephone Encounter (Signed)
New Message     *STAT* If patient is at the pharmacy, call can be transferred to refill team.   1. Which medications need to be refilled? (please list name of each medication and dose if known) PRASUGREL 10mg   2. Which pharmacy/location (including street and city if local pharmacy) is medication to be sent to? [facility]   3. Do they need a 30 day or 90 day supply? 30   Patient states that she has been on this medication since April 2018.  She needs a refill. Please call to discuss.

## 2017-09-19 NOTE — Telephone Encounter (Signed)
Refilled Effient. Prescription had expired and patient needs DAPT.  She has f/u with PR in April.

## 2017-09-19 NOTE — Telephone Encounter (Signed)
Requested medication not on current med list. Not on recent OV list either. Does patient need to be on medication? Please advise, thank you.

## 2017-09-19 NOTE — Telephone Encounter (Signed)
Pt called stating that her medication was sent to the wrong pharmacy. I resent pt's medication to the correct pharmacy [facility]. Confirmation received.

## 2017-11-23 ENCOUNTER — Ambulatory Visit (INDEPENDENT_AMBULATORY_CARE_PROVIDER_SITE_OTHER): Payer: Managed Care, Other (non HMO) | Admitting: Internal Medicine

## 2017-11-23 ENCOUNTER — Encounter: Payer: Self-pay | Admitting: Internal Medicine

## 2017-11-23 VITALS — BP 104/80 | HR 83 | Ht 63.0 in | Wt 165.0 lb

## 2017-11-23 DIAGNOSIS — I2510 Atherosclerotic heart disease of native coronary artery without angina pectoris: Secondary | ICD-10-CM | POA: Diagnosis not present

## 2017-11-23 DIAGNOSIS — E782 Mixed hyperlipidemia: Secondary | ICD-10-CM | POA: Diagnosis not present

## 2017-11-23 DIAGNOSIS — G4733 Obstructive sleep apnea (adult) (pediatric): Secondary | ICD-10-CM

## 2017-11-23 MED ORDER — CLOPIDOGREL BISULFATE 75 MG PO TABS: 75.0000 mg | ORAL_TABLET | Freq: Every day | ORAL | 3 refills | Status: DC

## 2017-11-23 NOTE — Progress Notes (Signed)
Cardiology Office Note   Date:  11/23/2017   ID:  Rebecca Dalton, DOB Oct 22, 1960, MRN [id number]  PCP:  Harlan Stains, MD  Cardiologist:   Dorris Carnes, MD   F/U of HTN and CAD     History of Present Illness: Rebecca Dalton is a 57 y.o. 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 amLODipine (NORVASC) 2.5 MG tablet Take 2.5 mg by mouth daily.  Marland Kitchen aspirin EC 81 MG tablet Take 1 tablet (81 mg total) by mouth daily.  Marland Kitchen atorvastatin (LIPITOR) 80 MG tablet TAKE 1 TABLET BY MOUTH DAILY AT 6 PM.  . BIOTIN PO Take 1 tablet by mouth daily.  . carvedilol (COREG) 25 MG tablet Take 25 mg by mouth 2 (two) times daily with a meal.  . Cholecalciferol (VITAMIN D PO) Take 3 capsules by mouth once a week.  . cyclobenzaprine (FLEXERIL) 10 MG tablet Take 5-10 mg by mouth daily as needed for muscle spasms  . hydrochlorothiazide (MICROZIDE) 12.5 MG capsule Take 12.5 mg by mouth daily.  Marland Kitchen ketotifen (ZADITOR) 0.025 % ophthalmic solution Place 1 drop into both eyes as needed (for allergies).  . LORazepam (ATIVAN) 0.5 MG tablet Take 0.5 mg by mouth daily as needed.   Marland Kitchen MAGNESIUM PO Take 1 tablet by mouth 4 (four) times a week.  . nitroGLYCERIN (NITROSTAT) 0.4 MG SL tablet Place 1 tablet (0.4 mg total) under the tongue every 5  (five) minutes as needed for chest pain.  Marland Kitchen olmesartan (BENICAR) 40 MG tablet Take 40 mg by mouth daily.  Marland Kitchen omeprazole (PRILOSEC) 20 MG capsule Take 20 mg by mouth daily as needed.   . polyethylene glycol (MIRALAX / GLYCOLAX) packet Take 17 g by mouth daily.   . prasugrel (EFFIENT) 10 MG TABS tablet Take 1 tablet (10 mg total) by mouth daily.  . Probiotic Product (PROBIOTIC PO) Take 1 capsule by mouth daily.     Allergies:   Penicillin g; Penicillins; Betamethasone; Codeine; Sulfa antibiotics; Sulfamethoxazole; Sulfasalazine; and Latex   Past Medical History:  Diagnosis Date  . Abnormal cardiovascular stress test 10/07/2016  . Anxiety   . CAD in native artery 10/07/2016  . Chronic bronchitis ([place])   . Coronary artery disease   . GERD (gastroesophageal reflux disease)   . HLD (hyperlipidemia) 10/07/2016  . HTN (hypertension) 10/07/2016  . Hypertension   . Pneumonia    "twice" (10/06/2016)  . S/P angioplasty with stent, 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/06/16 10/07/2016    Past Surgical History:  Procedure Laterality Date  .  ANTERIOR CERVICAL DECOMP/DISCECTOMY FUSION  ~ 2005   C6-7  . BACK SURGERY    . CORONARY ANGIOPLASTY WITH STENT PLACEMENT  10/06/2016  . CORONARY STENT INTERVENTION N/A 10/06/2016   Procedure: Coronary Stent Intervention;  Surgeon: Burnell Blanks, MD;  Location: [facility];  Service: Cardiovascular;  Laterality: N/A;  . ELBOW ARTHROSCOPY Right 05/03/2016   Procedure: RIGHT ARTHROSCOPY ELBOW WITH REMOVAL FOREIGN BODIES;  Surgeon: Leanora Cover, MD;  Location: [facility];  Service: Orthopedics;  Laterality: Right;  . LEFT HEART CATH AND CORONARY ANGIOGRAPHY N/A 10/06/2016   Procedure: Left Heart Cath and Coronary Angiography;  Surgeon: Burnell Blanks, MD;  Location: [facility];  Service: Cardiovascular;  Laterality: N/A;  . REFRACTIVE SURGERY Bilateral   . TONSILLECTOMY       Social History:  The patient  reports that she has been smoking.   She has a 19.50 pack-year smoking history. She has never used smokeless tobacco. She reports that she drinks about 12.6 oz of alcohol per week. She reports that she does not use drugs.   Family History:  The patient's family history includes Diabetes in her sister; Heart attack (age of onset: 68) in her sister; Heart attack (age of onset: 65) in her maternal grandmother; Heart attack (age of onset: 86) in her brother; Heart attack (age of onset: 58) in her father; Heart disease in her father.    ROS:  Please see the history of present illness. All other systems are reviewed and  Negative to the above problem except as noted.    PHYSICAL EXAM: VS:  BP 104/80   Pulse 83   Ht 5\' 3"  (1.6 m)   Wt 165 lb (74.8 kg)   SpO2 98%   BMI 29.23 kg/m   GEN: Overweight 57 yo in no acute distress  HEENT: normal  Neck: JVP not elevated   , carotid bruits, or masses Cardiac: RRR; no murmurs, rubs, or gallops,no edema  Respiratory:  CTA GI: soft, nontender, nondistended, + BS  No hepatomegaly  MS: no deformity Moving all extremities   Skin: warm and dry, no rash Neuro:  Strength and sensation are intact Psych: euthymic mood, full affect   EKG:  EKG is not ordered    SR 80 bpm   Nonspeicific ST changes   Lipid Panel    Component Value Date/Time   CHOL 143 06/22/2017 1040   TRIG 143 06/22/2017 1040   HDL 54 06/22/2017 1040   CHOLHDL 2.6 06/22/2017 1040   LDLCALC 60 06/22/2017 1040      Wt Readings from Last 3 Encounters:  11/23/17 165 lb (74.8 kg)  06/22/17 163 lb 12.8 oz (74.3 kg)  02/10/17 165 lb 12.8 oz (75.2 kg)      ASSESSMENT AND PLAN: 1  CAD  S/p PTCA/STENT to LAD in 2018  Switch to plavix  Stop elffient  Follow Switch to pepcid for heart burn 2  HL  LDL good in November  60  Contineu meds   3  BP  Very labile   AM ones can be high  Question if related to sleep apnea  Needs to treat and then follow  Get arm cuff (not wrist)  4  OSA  Tested  By dentist   Needs to get fitted  for device and follow   F/U in November      Current medicines are reviewed at length with the patient today.  The patient does not have concerns  regarding medicines.  Signed, Dorris Carnes, MD  11/23/2017 11:27 AM    [facility] [address], [place], [place]  [address] Phone: [phone]; Fax: [phone]

## 2017-11-23 NOTE — Patient Instructions (Addendum)
Your physician has recommended you make the following change in your medication:  1.) stop effient 2.) stop omeprazole 3.) start clopidegrel (Plavix) 75 mg once a day  Addendum: Try zantac or pepcid for occasional heartburn symptoms.   Please call office if this does not work, Dr. Harrington Challenger will prescribe pantoprazole.   Your physician wants you to follow-up in: November, 2019 with Dr. Harrington Challenger.  You will receive a reminder letter in the mail two months in advance. If you don't receive a letter, please call our office to schedule the follow-up appointment.

## 2017-12-05 ENCOUNTER — Other Ambulatory Visit: Payer: Self-pay | Admitting: Family Medicine

## 2017-12-05 ENCOUNTER — Other Ambulatory Visit (HOSPITAL_COMMUNITY)
Admission: RE | Admit: 2017-12-05 | Discharge: 2017-12-05 | Disposition: A | Discharge status: Home / Self Care | Payer: Managed Care, Other (non HMO) | Source: Ambulatory Visit | Attending: Family Medicine | Admitting: Family Medicine

## 2017-12-05 DIAGNOSIS — Z124 Encounter for screening for malignant neoplasm of cervix: Secondary | ICD-10-CM | POA: Diagnosis not present

## 2017-12-06 LAB — CYTOLOGY - PAP
Diagnosis: NEGATIVE
HPV: NOT DETECTED

## 2018-06-01 ENCOUNTER — Other Ambulatory Visit: Payer: Self-pay | Admitting: Internal Medicine

## 2018-06-05 DIAGNOSIS — G4733 Obstructive sleep apnea (adult) (pediatric): Secondary | ICD-10-CM | POA: Insufficient documentation

## 2018-06-05 NOTE — Progress Notes (Signed)
Cardiology Office Note    Date:  06/06/2018   ID:  Rebecca Dalton, DOB 1961-07-14, MRN [id number]  PCP:  Harlan Stains, MD  Cardiologist: No primary care provider on file. EPS: None  No chief complaint on file.   History of Present Illness:  Rebecca Dalton is a 57 y.o. female history of CAD with high risk Myoview 09/2016 and underwent cardiac catheterization status post DES to the LAD with very mild disease elsewhere and normal LV function.  Initially on Brilinta but worsening shortness of breath and switch to Effient.    Last saw Dr. Harrington Challenger 11/2017 and was having labile blood pressures.  It was up in the morning and she was taking a half a tablet of her blood pressure medicines to walk.  Dr. Harrington Challenger question if it was related to her sleep apnea and the need to treat it. Sleep apnea that was diagnosed by her dentist but not treated.  So advised to get an arm blood pressure cuff.  Patient comes in for f/u. BP up if she doesn't sleep well. Never had sleep apnea treated. Just got disability so will get it treated. BP 130/75-85 usually. Can get up to 150/90 before she takes her meds if she does not sleep well. Denies chest pain, palpitations, dizziness or presyncope.  Still smoking half a pack of cigarettes daily.  Going to join a gym and start an exercise program.    Past Medical History:  Diagnosis Date  . Abnormal cardiovascular stress test 10/07/2016  . Anxiety   . CAD in native artery 10/07/2016  . Chronic bronchitis ([place])   . Coronary artery disease   . GERD (gastroesophageal reflux disease)   . HLD (hyperlipidemia) 10/07/2016  . HTN (hypertension) 10/07/2016  . Hypertension   . Pneumonia    "twice" (10/06/2016)  . S/P angioplasty with stent, 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/06/16 10/07/2016    Past Surgical History:  Procedure Laterality Date  . ANTERIOR CERVICAL DECOMP/DISCECTOMY FUSION  ~ 2005   C6-7  . BACK SURGERY    . CORONARY ANGIOPLASTY WITH STENT PLACEMENT  10/06/2016  . CORONARY STENT  INTERVENTION N/A 10/06/2016   Procedure: Coronary Stent Intervention;  Surgeon: Burnell Blanks, MD;  Location: [facility];  Service: Cardiovascular;  Laterality: N/A;  . ELBOW ARTHROSCOPY Right 05/03/2016   Procedure: RIGHT ARTHROSCOPY ELBOW WITH REMOVAL FOREIGN BODIES;  Surgeon: Leanora Cover, MD;  Location: [facility];  Service: Orthopedics;  Laterality: Right;  . LEFT HEART CATH AND CORONARY ANGIOGRAPHY N/A 10/06/2016   Procedure: Left Heart Cath and Coronary Angiography;  Surgeon: Burnell Blanks, MD;  Location: [facility];  Service: Cardiovascular;  Laterality: N/A;  . REFRACTIVE SURGERY Bilateral   . TONSILLECTOMY      Current Medications: Current Meds  Medication Sig  . amLODipine (NORVASC) 2.5 MG tablet Take 2.5 mg by mouth daily.  Marland Kitchen aspirin EC 81 MG tablet Take 1 tablet (81 mg total) by mouth daily.  Marland Kitchen atorvastatin (LIPITOR) 80 MG tablet TAKE 1 TABLET BY MOUTH DAILY AT 6 PM.  . BIOTIN PO Take 1 tablet by mouth daily.  . carvedilol (COREG) 25 MG tablet Take 25 mg by mouth 2 (two) times daily with a meal.  . Cholecalciferol (VITAMIN D PO) Take 3 capsules by mouth once a week.  . clopidogrel (PLAVIX) 75 MG tablet Take 1 tablet (75 mg total) by mouth daily.  . cyclobenzaprine (FLEXERIL) 10 MG tablet Take 5-10 mg by mouth  daily as needed for muscle spasms  . hydrochlorothiazide (MICROZIDE) 12.5 MG capsule Take 12.5 mg by mouth daily.  Marland Kitchen ketotifen (ZADITOR) 0.025 % ophthalmic solution Place 1 drop into both eyes as needed (for allergies).  . LORazepam (ATIVAN) 0.5 MG tablet Take 0.5 mg by mouth daily as needed.   Marland Kitchen MAGNESIUM PO Take 1 tablet by mouth 4 (four) times a week.  . nitroGLYCERIN (NITROSTAT) 0.4 MG SL tablet Place 1 tablet (0.4 mg total) under the tongue every 5 (five) minutes as needed for chest pain.  Marland Kitchen olmesartan (BENICAR) 40 MG tablet Take 40 mg by mouth daily.  . polyethylene glycol (MIRALAX / GLYCOLAX) packet Take 17 g by mouth  daily.   . Probiotic Product (PROBIOTIC PO) Take 1 capsule by mouth daily.     Allergies:   Penicillin g; Penicillins; Betamethasone; Codeine; Sulfa antibiotics; Sulfamethoxazole; Sulfasalazine; and Latex   Social History   Socioeconomic History  . Marital status: Single    Spouse name: Not on file  . Number of children: Not on file  . Years of education: Not on file  . Highest education level: Not on file  Occupational History  . Not on file  Social Needs  . Financial resource strain: Not on file  . Food insecurity:    Worry: Not on file    Inability: Not on file  . Transportation needs:    Medical: Not on file    Non-medical: Not on file  Tobacco Use  . Smoking status: Light Tobacco Smoker    Packs/day: 0.50    Years: 39.00    Pack years: 19.50    Last attempt to quit: 10/06/2016    Years since quitting: 1.6  . Smokeless tobacco: Never Used  Substance and Sexual Activity  . Alcohol use: Yes    Alcohol/week: 21.0 standard drinks    Types: 6 Cans of beer, 15 Shots of liquor per week    Comment: 10/06/2016 "3, 1 shot mixed drinks or 3 beers/day; mostly mixed drinks"  . Drug use: No  . Sexual activity: Not Currently  Lifestyle  . Physical activity:    Days per week: Not on file    Minutes per session: Not on file  . Stress: Not on file  Relationships  . Social connections:    Talks on phone: Not on file    Gets together: Not on file    Attends religious service: Not on file    Active member of club or organization: Not on file    Attends meetings of clubs or organizations: Not on file    Relationship status: Not on file  Other Topics Concern  . Not on file  Social History Narrative  . Not on file     Family History:  The patient's family history includes Diabetes in her sister; Heart attack (age of onset: 96) in her sister; Heart attack (age of onset: 19) in her maternal grandmother; Heart attack (age of onset: 65) in her brother; Heart attack (age of onset: 44)  in her father; Heart disease in her father.   ROS:   Please see the history of present illness.    Review of Systems  Constitution: Negative.  HENT: Negative.   Eyes: Negative.   Cardiovascular: Negative.   Respiratory: Positive for sleep disturbances due to breathing and snoring.   Hematologic/Lymphatic: Negative.   Musculoskeletal: Negative.  Negative for joint pain.  Gastrointestinal: Negative.   Genitourinary: Negative.   Neurological: Negative.    All  other systems reviewed and are negative.   PHYSICAL EXAM:   VS:  BP 132/78   Pulse 78   Ht 5\' 3"  (1.6 m)   Wt 166 lb (75.3 kg)   SpO2 98%   BMI 29.41 kg/m   Physical Exam  GEN: Well nourished, well developed, in no acute distress  Neck: no JVD, carotid bruits, or masses Cardiac:RRR; no murmurs, rubs, or gallops  Respiratory:  clear to auscultation bilaterally, normal work of breathing GI: soft, nontender, nondistended, + BS Ext: without cyanosis, clubbing, or edema, Good distal pulses bilaterally Neuro:  Alert and Oriented x 3 Psych: euthymic mood, full affect  Wt Readings from Last 3 Encounters:  06/06/18 166 lb (75.3 kg)  11/23/17 165 lb (74.8 kg)  06/22/17 163 lb 12.8 oz (74.3 kg)      Studies/Labs Reviewed:   EKG:  EKG is not ordered today. Lipid Panel    Component Value Date/Time   CHOL 143 06/22/2017 1040   TRIG 143 06/22/2017 1040   HDL 54 06/22/2017 1040   CHOLHDL 2.6 06/22/2017 1040   LDLCALC 60 06/22/2017 1040    Additional studies/ records that were reviewed today include:   Cardiac catheterization 2/2018Mid RCA lesion, 20 %stenosed.  Mid RCA to Dist RCA lesion, 10 %stenosed.  Ost Cx to Prox Cx lesion, 20 %stenosed.  Ost Ramus to Ramus lesion, 20 %stenosed.  A STENT RESOLUTE ONYX 3.0X15 drug eluting stent was successfully placed.  Prox LAD to Mid LAD lesion, 99 %stenosed.  Post intervention, there is a 0% residual stenosis.  The left ventricular systolic function is normal.  LV  end diastolic pressure is normal.  The left ventricular ejection fraction is greater than 65% by visual estimate.  There is no mitral valve regurgitation.   1. Unstable angina  2. Severe stenosis mid LAD 3. Mild non-obstructive disease in the RCA and Circumflex 4. Normal LV systolic function 5. Successful PTCA/DES x 1 mid LAD   Recommendations: She will need DAPT with ASA and Brilinta for one year. I will start a statin. Continue beta blocker. Discharge in am.        ASSESSMENT:    1. CAD in native artery   2. Essential hypertension   3. Mixed hyperlipidemia   4. OSA (obstructive sleep apnea)   5. Need for immunization against influenza   6. Tobacco abuse      PLAN:  In order of problems listed above:   CAD status post DES to the LAD 09/2016 with minimal disease elsewhere and normal LV function.  Now on Effient and aspirin.  No angina.  Recommend 30 minutes of exercise daily.  Essential hypertension blood pressure has been up in the morning if she does not get good rest.  She is going to get her OSA treated now that she has disability.  This may help.  Hyperlipidemia on Lipitor and LDL 68 12/05/2017  OSA diagnosed by her dentist and she is going to get a mouthguard now that she is on disability.  Tobacco abuse smoking cessation discussed.  Offered the nicotine patches but patient is not ready to quit.   Medication Adjustments/Labs and Tests Ordered: Current medicines are reviewed at length with the patient today.  Concerns regarding medicines are outlined above.  Medication changes, Labs and Tests ordered today are listed in the Patient Instructions below. Patient Instructions  Medication Instructions:  Your physician recommends that you continue on your current medications as directed. Please refer to the Current Medication list given  to you today.  If you need a refill on your cardiac medications before your next appointment, please call your pharmacy.   Lab  work: None ordered If you have labs (blood work) drawn today and your tests are completely normal, you will receive your results only by: Marland Kitchen MyChart Message (if you have MyChart) OR . A paper copy in the mail If you have any lab test that is abnormal or we need to change your treatment, we will call you to review the results.  Testing/Procedures: None ordered  Follow-Up: At [facility], you and your health needs are our priority.  As part of our continuing mission to provide you with exceptional heart care, we have created designated Provider Care Teams.  These Care Teams include your primary Cardiologist (physician) and Advanced Practice Providers (APPs -  Physician Assistants and Nurse Practitioners) who all work together to provide you with the care you need, when you need it. You will need a follow up appointment in:  6 months.  Please call our office 2 months in advance to schedule this appointment.  You may see Dr. Harrington Challenger or one of the following Advanced Practice Providers on your designated Care Team: Richardson Dopp, PA-C [place], [place] . Daune Perch, NP  Any Other Special Instructions Will Be Listed Below (If Applicable).  YOU SHOULD BE GETTING 30 MINUTES OF EXERCISE A DAY  Steps to Quit Smoking Smoking tobacco can be bad for your health. It can also affect almost every organ in your body. Smoking puts you and people around you at risk for many serious long-lasting (chronic) diseases. Quitting smoking is hard, but it is one of the best things that you can do for your health. It is never too late to quit. What are the benefits of quitting smoking? When you quit smoking, you lower your risk for getting serious diseases and conditions. They can include:  Lung cancer or lung disease.  Heart disease.  Stroke.  Heart attack.  Not being able to have children (infertility).  Weak bones (osteoporosis) and broken bones (fractures).  If you have coughing, wheezing, and shortness  of breath, those symptoms may get better when you quit. You may also get sick less often. If you are pregnant, quitting smoking can help to lower your chances of having a baby of low birth weight. What can I do to help me quit smoking? Talk with your doctor about what can help you quit smoking. Some things you can do (strategies) include:  Quitting smoking totally, instead of slowly cutting back how much you smoke over a period of time.  Going to in-person counseling. You are more likely to quit if you go to many counseling sessions.  Using resources and support systems, such as: ? Database administrator with a Social worker. ? Phone quitlines. ? Careers information officer. ? Support groups or group counseling. ? Text messaging programs. ? Mobile phone apps or applications.  Taking medicines. Some of these medicines may have nicotine in them. If you are pregnant or breastfeeding, do not take any medicines to quit smoking unless your doctor says it is okay. Talk with your doctor about counseling or other things that can help you.  Talk with your doctor about using more than one strategy at the same time, such as taking medicines while you are also going to in-person counseling. This can help make quitting easier. What things can I do to make it easier to quit? Quitting smoking might feel very hard at first,  but there is a lot that you can do to make it easier. Take these steps:  Talk to your family and friends. Ask them to support and encourage you.  Call phone quitlines, reach out to support groups, or work with a Social worker.  Ask people who smoke to not smoke around you.  Avoid places that make you want (trigger) to smoke, such as: ? Bars. ? Parties. ? Smoke-break areas at work.  Spend time with people who do not smoke.  Lower the stress in your life. Stress can make you want to smoke. Try these things to help your stress: ? Getting regular exercise. ? Deep-breathing  exercises. ? Yoga. ? Meditating. ? Doing a body scan. To do this, close your eyes, focus on one area of your body at a time from head to toe, and notice which parts of your body are tense. Try to relax the muscles in those areas.  Download or buy apps on your mobile phone or tablet that can help you stick to your quit plan. There are many free apps, such as QuitGuide from the [employer] Office manager for Disease Control and Prevention). You can find more support from smokefree.gov and other websites.  This information is not intended to replace advice given to you by your health care provider. Make sure you discuss any questions you have with your health care provider. Document Released: 06/04/2009 Document Revised: 04/05/2016 Document Reviewed: 12/23/2014 Elsevier Interactive Patient Education  2018 [place], [place]alinda [place], [place]  06/06/2018 10:14 AM    [facility] [address], [place], [place]  [address] Phone: [phone]; Fax: [phone]

## 2018-06-06 ENCOUNTER — Ambulatory Visit (INDEPENDENT_AMBULATORY_CARE_PROVIDER_SITE_OTHER): Payer: Managed Care, Other (non HMO) | Admitting: Physician Assistant

## 2018-06-06 ENCOUNTER — Encounter: Payer: Self-pay | Admitting: Physician Assistant

## 2018-06-06 VITALS — BP 132/78 | HR 78 | Ht 63.0 in | Wt 166.0 lb

## 2018-06-06 DIAGNOSIS — Z23 Encounter for immunization: Secondary | ICD-10-CM

## 2018-06-06 DIAGNOSIS — I10 Essential (primary) hypertension: Secondary | ICD-10-CM

## 2018-06-06 DIAGNOSIS — Z720 Tobacco use: Secondary | ICD-10-CM | POA: Insufficient documentation

## 2018-06-06 DIAGNOSIS — G4733 Obstructive sleep apnea (adult) (pediatric): Secondary | ICD-10-CM | POA: Diagnosis not present

## 2018-06-06 DIAGNOSIS — E782 Mixed hyperlipidemia: Secondary | ICD-10-CM | POA: Diagnosis not present

## 2018-06-06 DIAGNOSIS — I2510 Atherosclerotic heart disease of native coronary artery without angina pectoris: Secondary | ICD-10-CM

## 2018-06-06 NOTE — Patient Instructions (Signed)
Medication Instructions:  Your physician recommends that you continue on your current medications as directed. Please refer to the Current Medication list given to you today.  If you need a refill on your cardiac medications before your next appointment, please call your pharmacy.   Lab work: None ordered If you have labs (blood work) drawn today and your tests are completely normal, you will receive your results only by: Marland Kitchen MyChart Message (if you have MyChart) OR . A paper copy in the mail If you have any lab test that is abnormal or we need to change your treatment, we will call you to review the results.  Testing/Procedures: None ordered  Follow-Up: At [facility], you and your health needs are our priority.  As part of our continuing mission to provide you with exceptional heart care, we have created designated Provider Care Teams.  These Care Teams include your primary Cardiologist (physician) and Advanced Practice Providers (APPs -  Physician Assistants and Nurse Practitioners) who all work together to provide you with the care you need, when you need it. You will need a follow up appointment in:  6 months.  Please call our office 2 months in advance to schedule this appointment.  You may see Dr. Harrington Challenger or one of the following Advanced Practice Providers on your designated Care Team: Richardson Dopp, PA-C [place], [place] . Daune Perch, NP  Any Other Special Instructions Will Be Listed Below (If Applicable).  YOU SHOULD BE GETTING 30 MINUTES OF EXERCISE A DAY  Steps to Quit Smoking Smoking tobacco can be bad for your health. It can also affect almost every organ in your body. Smoking puts you and people around you at risk for many serious long-lasting (chronic) diseases. Quitting smoking is hard, but it is one of the best things that you can do for your health. It is never too late to quit. What are the benefits of quitting smoking? When you quit smoking, you lower your risk for  getting serious diseases and conditions. They can include:  Lung cancer or lung disease.  Heart disease.  Stroke.  Heart attack.  Not being able to have children (infertility).  Weak bones (osteoporosis) and broken bones (fractures).  If you have coughing, wheezing, and shortness of breath, those symptoms may get better when you quit. You may also get sick less often. If you are pregnant, quitting smoking can help to lower your chances of having a baby of low birth weight. What can I do to help me quit smoking? Talk with your doctor about what can help you quit smoking. Some things you can do (strategies) include:  Quitting smoking totally, instead of slowly cutting back how much you smoke over a period of time.  Going to in-person counseling. You are more likely to quit if you go to many counseling sessions.  Using resources and support systems, such as: ? Database administrator with a Social worker. ? Phone quitlines. ? Careers information officer. ? Support groups or group counseling. ? Text messaging programs. ? Mobile phone apps or applications.  Taking medicines. Some of these medicines may have nicotine in them. If you are pregnant or breastfeeding, do not take any medicines to quit smoking unless your doctor says it is okay. Talk with your doctor about counseling or other things that can help you.  Talk with your doctor about using more than one strategy at the same time, such as taking medicines while you are also going to in-person counseling. This can help  make quitting easier. What things can I do to make it easier to quit? Quitting smoking might feel very hard at first, but there is a lot that you can do to make it easier. Take these steps:  Talk to your family and friends. Ask them to support and encourage you.  Call phone quitlines, reach out to support groups, or work with a Social worker.  Ask people who smoke to not smoke around you.  Avoid places that make you want  (trigger) to smoke, such as: ? Bars. ? Parties. ? Smoke-break areas at work.  Spend time with people who do not smoke.  Lower the stress in your life. Stress can make you want to smoke. Try these things to help your stress: ? Getting regular exercise. ? Deep-breathing exercises. ? Yoga. ? Meditating. ? Doing a body scan. To do this, close your eyes, focus on one area of your body at a time from head to toe, and notice which parts of your body are tense. Try to relax the muscles in those areas.  Download or buy apps on your mobile phone or tablet that can help you stick to your quit plan. There are many free apps, such as QuitGuide from the [employer] Office manager for Disease Control and Prevention). You can find more support from smokefree.gov and other websites.  This information is not intended to replace advice given to you by your health care provider. Make sure you discuss any questions you have with your health care provider. Document Released: 06/04/2009 Document Revised: 04/05/2016 Document Reviewed: 12/23/2014 Elsevier Interactive Patient Education  2018 [employer].

## 2018-11-26 ENCOUNTER — Other Ambulatory Visit: Payer: Self-pay | Admitting: Internal Medicine

## 2018-12-04 ENCOUNTER — Telehealth: Payer: Self-pay

## 2018-12-04 NOTE — Telephone Encounter (Signed)
    Virtual Visit Pre-Appointment Phone Call  Steps For Call:  1. Confirm consent - "In the setting of the current Covid19 crisis, you are scheduled for a VIDEO visit with your provider on 4/20 at 1130am.  Just as we do with many in-office visits, in order for you to participate in this visit, we must obtain consent.  If you'd like, I can send this to your mychart (if signed up) or email for you to review.  Otherwise, I can obtain your verbal consent now.  All virtual visits are billed to your insurance company just like a normal visit would be.  By agreeing to a virtual visit, we'd like you to understand that the technology does not allow for your provider to perform an examination, and thus may limit your provider's ability to fully assess your condition.  Finally, though the technology is pretty good, we cannot assure that it will always work on either your or our end, and in the setting of a video visit, we may have to convert it to a phone-only visit.  In either situation, we cannot ensure that we have a secure connection.  Are you willing to proceed?" STAFF: Did the patient verbally acknowledge consent to telehealth visit? YES  2. Confirm the BEST phone number to call the day of the visit: [phone]  3. Give patient instructions for WebEx/MyChart download to smartphone as below or [email or web address] if video visit (depending on what platform provider is using)  4. Advise patient to be prepared with any vital sign or heart rhythm information, their current medicines, and a piece of paper and pen handy for any instructions they may receive the day of their visit  5. Inform patient they will receive a phone call 15 minutes prior to their appointment time (may be from unknown caller ID) so they should be prepared to answer  6. Confirm that appointment type is correct in Epic appointment notes (video vs telephone)     TELEPHONE CALL NOTE  Rebecca Dalton has been deemed a candidate for a  follow-up tele-health visit to limit community exposure during the Covid-19 pandemic. I spoke with the patient via phone to ensure availability of phone/video source, confirm preferred email & phone number, and discuss instructions and expectations.  I reminded Rebecca Dalton to be prepared with any vital sign and/or heart rhythm information that could potentially be obtained via home monitoring, at the time of her visit. I reminded Rebecca Dalton to expect a phone call at the time of her visit if her visit.  Wilma Flavin, RN 12/04/2018 2:51 PM   .

## 2018-12-10 ENCOUNTER — Other Ambulatory Visit: Payer: Self-pay

## 2018-12-10 ENCOUNTER — Encounter: Payer: Self-pay | Admitting: Internal Medicine

## 2018-12-10 ENCOUNTER — Telehealth (INDEPENDENT_AMBULATORY_CARE_PROVIDER_SITE_OTHER): Payer: Medicare Other | Admitting: Internal Medicine

## 2018-12-10 VITALS — BP 128/85 | HR 70 | Ht 63.0 in | Wt 166.0 lb

## 2018-12-10 DIAGNOSIS — G4733 Obstructive sleep apnea (adult) (pediatric): Secondary | ICD-10-CM | POA: Diagnosis not present

## 2018-12-10 DIAGNOSIS — E782 Mixed hyperlipidemia: Secondary | ICD-10-CM

## 2018-12-10 DIAGNOSIS — I2510 Atherosclerotic heart disease of native coronary artery without angina pectoris: Secondary | ICD-10-CM

## 2018-12-10 DIAGNOSIS — Z720 Tobacco use: Secondary | ICD-10-CM

## 2018-12-10 DIAGNOSIS — I10 Essential (primary) hypertension: Secondary | ICD-10-CM | POA: Diagnosis not present

## 2018-12-10 MED ORDER — CLOPIDOGREL BISULFATE 75 MG PO TABS: 75.0000 mg | ORAL_TABLET | Freq: Every day | ORAL | 3 refills | Status: DC

## 2018-12-10 NOTE — Progress Notes (Signed)
Virtual Visit via Video Note   This visit type was conducted due to national recommendations for restrictions regarding the COVID-19 Pandemic (e.g. social distancing) in an effort to limit this patient's exposure and mitigate transmission in our community.  Due to her co-morbid illnesses, this patient is at least at moderate risk for complications without adequate follow up.  This format is felt to be most appropriate for this patient at this time.  All issues noted in this document were discussed and addressed.  A limited physical exam was performed with this format.  Please refer to the patient's chart for her consent to telehealth for [facility].   Evaluation Performed:  Follow-up visit  Date:  12/10/2018   ID:  Rebecca Dalton, DOB October 29, 1960, MRN [id number]  Patient Location: Home Provider Location: Home  PCP:  Harlan Stains, MD  Cardiologist:  Harrington Challenger Electrophysiologist:  None   Chief Complaint:   F/U of CAD    History of Present Illness:    Rebecca Dalton is a 58 y.o. female with hx of HTN and CAD   I saw her last spring  In 2018 she had myovue then cath that showed 99% LAD leasion    She underwent PTCA/ DES to LAD   Brilinta switched to Efflient   She was seen by Gerrianne Scale in October 2019  BP up to 150 at times   Since seen by Gerrianne Scale she has done well  Breathing is good   No CP   Nothing like what she experienced prior to stent     She never got apparatus for sleep apnea   (dx mild to moderate) Continues to smoke about 1/2 ppd     The patient does not  have symptoms concerning for COVID-19 infection (fever, chills, cough, or new shortness of breath).    Past Medical History:  Diagnosis Date  . Abnormal cardiovascular stress test 10/07/2016  . Anxiety   . CAD in native artery 10/07/2016  . Chronic bronchitis ([place])   . Coronary artery disease   . GERD (gastroesophageal reflux disease)   . HLD (hyperlipidemia) 10/07/2016  . HTN (hypertension) 10/07/2016  . Hypertension    . Pneumonia    "twice" (10/06/2016)  . S/P angioplasty with stent, 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/06/16 10/07/2016   Past Surgical History:  Procedure Laterality Date  . ANTERIOR CERVICAL DECOMP/DISCECTOMY FUSION  ~ 2005   C6-7  . BACK SURGERY    . CORONARY ANGIOPLASTY WITH STENT PLACEMENT  10/06/2016  . CORONARY STENT INTERVENTION N/A 10/06/2016   Procedure: Coronary Stent Intervention;  Surgeon: Burnell Blanks, MD;  Location: [place] [facility];  Service: Cardiovascular;  Laterality: N/A;  . ELBOW ARTHROSCOPY Right 05/03/2016   Procedure: RIGHT ARTHROSCOPY ELBOW WITH REMOVAL FOREIGN BODIES;  Surgeon: Leanora Cover, MD;  Location: [place];  Service: Orthopedics;  Laterality: Right;  . LEFT HEART CATH AND CORONARY ANGIOGRAPHY N/A 10/06/2016   Procedure: Left Heart Cath and Coronary Angiography;  Surgeon: Burnell Blanks, MD;  Location: [place] [facility];  Service: Cardiovascular;  Laterality: N/A;  . REFRACTIVE SURGERY Bilateral   . TONSILLECTOMY       Current Meds  Medication Sig  . amLODipine (NORVASC) 2.5 MG tablet Take 2.5 mg by mouth daily.  Marland Kitchen aspirin EC 81 MG tablet Take 1 tablet (81 mg total) by mouth daily.  Marland Kitchen atorvastatin (LIPITOR) 80 MG tablet TAKE 1 TABLET BY MOUTH DAILY AT 6 PM.  . BIOTIN PO  Take 1 tablet by mouth daily.  . carvedilol (COREG) 25 MG tablet Take 25 mg by mouth 2 (two) times daily with a meal.  . Cholecalciferol (VITAMIN D PO) Take 3 capsules by mouth once a week.  . clopidogrel (PLAVIX) 75 MG tablet Take 1 tablet (75 mg total) by mouth daily.  . cyclobenzaprine (FLEXERIL) 10 MG tablet Take 5-10 mg by mouth daily as needed for muscle spasms  . hydrochlorothiazide (MICROZIDE) 12.5 MG capsule Take 12.5 mg by mouth daily.  Marland Kitchen ketotifen (ZADITOR) 0.025 % ophthalmic solution Place 1 drop into both eyes as needed (for allergies).  . LORazepam (ATIVAN) 0.5 MG tablet Take 0.5 mg by mouth daily as needed.   Marland Kitchen MAGNESIUM PO Take 1 tablet by mouth 4 (four)  times a week.  . nitroGLYCERIN (NITROSTAT) 0.4 MG SL tablet Place 1 tablet (0.4 mg total) under the tongue every 5 (five) minutes as needed for chest pain.  Marland Kitchen olmesartan (BENICAR) 40 MG tablet Take 40 mg by mouth daily.  . polyethylene glycol (MIRALAX / GLYCOLAX) packet Take 17 g by mouth daily.   . Probiotic Product (PROBIOTIC PO) Take 1 capsule by mouth daily.  . [DISCONTINUED] clopidogrel (PLAVIX) 75 MG tablet Take 1 tablet (75 mg total) by mouth daily.     Allergies:   Penicillin g; Penicillins; Betamethasone; Codeine; Sulfa antibiotics; Sulfamethoxazole; Sulfasalazine; and Latex   Social History   Tobacco Use  . Smoking status: Light Tobacco Smoker    Packs/day: 0.50    Years: 39.00    Pack years: 19.50    Last attempt to quit: 10/06/2016    Years since quitting: 2.1  . Smokeless tobacco: Never Used  Substance Use Topics  . Alcohol use: Yes    Alcohol/week: 21.0 standard drinks    Types: 6 Cans of beer, 15 Shots of liquor per week    Comment: 10/06/2016 "3, 1 shot mixed drinks or 3 beers/day; mostly mixed drinks"  . Drug use: No     Family Hx: The patient's family history includes Diabetes in her sister; Heart attack (age of onset: 53) in her sister; Heart attack (age of onset: 58) in her maternal grandmother; Heart attack (age of onset: 36) in her brother; Heart attack (age of onset: 73) in her father; Heart disease in her father.  ROS:   Please see the history of present illness.    All other systems reviewed and are negative.   Prior CV studies:   The following studies were reviewed today:  Labs/Other Tests and Data Reviewed:    EKG:  Not done as tele visit  Recent Labs: No results found for requested labs within last 8760 hours.   Recent Lipid Panel Lab Results  Component Value Date/Time   CHOL 143 06/22/2017 10:40 AM   TRIG 143 06/22/2017 10:40 AM   HDL 54 06/22/2017 10:40 AM   CHOLHDL 2.6 06/22/2017 10:40 AM   LDLCALC 60 06/22/2017 10:40 AM    Wt  Readings from Last 3 Encounters:  12/10/18 166 lb (75.3 kg)  06/06/18 166 lb (75.3 kg)  11/23/17 165 lb (74.8 kg)     Objective:    Vital Signs:  BP 128/85   Pulse 70   Ht 5\' 3"  (1.6 m)   Wt 166 lb (75.3 kg)   BMI 29.41 kg/m   Not done as tele visit    ASSESSMENT & PLAN:    1. CAD  No symptoms to sugg angina   Will keep on Plavix given  location of stent   Due to have routine labs soon with C [facility]   Will review   2  HTN  BP is doing good   Keep on same regimen  3  HL   On lipitor   Due to have labs at end of April     5  OSA   WIll review sleep study    No intervention done   6  Tobacco abuse  Counselled on cutting back, cessation     7  COVID-19 Education: The signs and symptoms of COVID-19 were discussed with the patient and how to seek care for testing (follow up with PCP or arrange E-visit).  The importance of social distancing was discussed today.  Time:   Today, I have spent 25  minutes with the patient with telehealth technology discussing the above problems.     Medication Adjustments/Labs and Tests Ordered: Current medicines are reviewed at length with the patient today.  Concerns regarding medicines are outlined above.   Tests Ordered: No orders of the defined types were placed in this encounter.   Medication Changes: Meds ordered this encounter  Medications  . clopidogrel (PLAVIX) 75 MG tablet    Sig: Take 1 tablet (75 mg total) by mouth daily.    Dispense:  90 tablet    Refill:  3    Disposition:  Follow up F/U in September 2020 Signed, Dorris Carnes, MD  12/10/2018 11:36 AM    [place]

## 2018-12-10 NOTE — Patient Instructions (Signed)
Medication Instructions:  No chanages If you need a refill on your cardiac medications before your next appointment, please call your pharmacy.   Lab work: none If you have labs (blood work) drawn today and your tests are completely normal, you will receive your results only by: Marland Kitchen MyChart Message (if you have MyChart) OR . A paper copy in the mail If you have any lab test that is abnormal or we need to change your treatment, we will call you to review the results.  Testing/Procedures: none  . Follow-Up: . Dr. Harrington Challenger will see you back in the office in September.  I went ahead and scheduled you for 05/06/19 at 8:00 am.  Please call the office if you need to change the appointment date/time.  Any Other Special Instructions Will Be Listed Below (If Applicable). none

## 2019-03-06 ENCOUNTER — Other Ambulatory Visit: Payer: Self-pay | Admitting: Family Medicine

## 2019-03-06 DIAGNOSIS — M5416 Radiculopathy, lumbar region: Secondary | ICD-10-CM

## 2019-03-06 DIAGNOSIS — M5442 Lumbago with sciatica, left side: Secondary | ICD-10-CM

## 2019-03-08 ENCOUNTER — Ambulatory Visit
Admission: RE | Admit: 2019-03-08 | Discharge: 2019-03-08 | Disposition: A | Discharge status: Home / Self Care | Payer: Medicare Other | Source: Ambulatory Visit | Attending: Family Medicine | Admitting: Family Medicine

## 2019-03-08 DIAGNOSIS — M5442 Lumbago with sciatica, left side: Secondary | ICD-10-CM

## 2019-03-08 DIAGNOSIS — M5416 Radiculopathy, lumbar region: Secondary | ICD-10-CM

## 2019-05-06 ENCOUNTER — Ambulatory Visit: Payer: Medicare Other | Admitting: Internal Medicine

## 2019-05-07 ENCOUNTER — Encounter: Payer: Self-pay | Admitting: Physician Assistant

## 2019-05-07 ENCOUNTER — Telehealth: Payer: Self-pay | Admitting: *Deleted

## 2019-05-07 ENCOUNTER — Ambulatory Visit (INDEPENDENT_AMBULATORY_CARE_PROVIDER_SITE_OTHER): Payer: Medicare Other | Admitting: Physician Assistant

## 2019-05-07 ENCOUNTER — Other Ambulatory Visit: Payer: Self-pay

## 2019-05-07 VITALS — BP 122/78 | HR 70 | Ht 63.0 in | Wt 169.8 lb

## 2019-05-07 DIAGNOSIS — I2510 Atherosclerotic heart disease of native coronary artery without angina pectoris: Secondary | ICD-10-CM

## 2019-05-07 DIAGNOSIS — Z01810 Encounter for preprocedural cardiovascular examination: Secondary | ICD-10-CM | POA: Diagnosis not present

## 2019-05-07 DIAGNOSIS — I10 Essential (primary) hypertension: Secondary | ICD-10-CM | POA: Diagnosis not present

## 2019-05-07 DIAGNOSIS — E782 Mixed hyperlipidemia: Secondary | ICD-10-CM

## 2019-05-07 NOTE — Patient Instructions (Signed)
Medication Instructions:  Your physician recommends that you continue on your current medications as directed. Please refer to the Current Medication list given to you today.  If you need a refill on your cardiac medications before your next appointment, please call your pharmacy.   Lab work: None ordered  If you have labs (blood work) drawn today and your tests are completely normal, you will receive your results only by: . MyChart Message (if you have MyChart) OR . A paper copy in the mail If you have any lab test that is abnormal or we need to change your treatment, we will call you to review the results.  Testing/Procedures: None ordered   Follow-Up: At [facility], you and your health needs are our priority.  As part of our continuing mission to provide you with exceptional heart care, we have created designated Provider Care Teams.  These Care Teams include your primary Cardiologist (physician) and Advanced Practice Providers (APPs -  Physician Assistants and Nurse Practitioners) who all work together to provide you with the care you need, when you need it. You will need a follow up appointment in:  6 months.  Please call our office 2 months in advance to schedule this appointment.  You may see Paula Ross, MD or one of the following Advanced Practice Providers on your designated Care Team: Scott Weaver, PA-C Vin Bhagat, PA-C . Janine Hammond, NP  Any Other Special Instructions Will Be Listed Below (If Applicable).    

## 2019-05-07 NOTE — Progress Notes (Signed)
Patent with intervention in 2018   OK to hold ASA and PLavix   WOuld resume post surgery when safe

## 2019-05-07 NOTE — Telephone Encounter (Signed)
   [facility] Pre-operative Risk Assessment    Request for surgical clearance:  1. What type of surgery is being performed? LUMBAR SPINE INJECTIONS   2. When is this surgery scheduled? TBD   3. What type of clearance is required (medical clearance vs. Pharmacy clearance to hold med vs. Both)? MEDICAL  4. Are there any medications that need to be held prior to surgery and how long? PLAVIX and ASA TO BE HELD 7 DAYS PRIOR   5. Practice name and name of physician performing surgery? [facility]; DR.  Gwenlyn Perking [place]   6. What is your office phone number [phone]    7.   What is your office fax number [phone]  8.   Anesthesia type (None, local, MAC, general) ? NONE LISTED; LOCAL ?    Julaine Hua 05/07/2019, 10:38 AM  _________________________________________________________________   (provider comments below)

## 2019-05-07 NOTE — Telephone Encounter (Signed)
Patient is seeing Robbie Lis, PA-C today (05/07/2019) at 3:15. Surgical clearance can be addressed during that visit. I will update Dr. Harrington Challenger and ask her to send recommendations regarding antiplatelet Rx to Mr. Curly [place].  This note will be removed from the Preop APP Pool.  Richardson Dopp, PA-C    05/07/2019 1:59 PM

## 2019-05-07 NOTE — Progress Notes (Signed)
Cardiology Office Note    Date:  05/07/2019   ID:  [name], DOB May 17, 1961, MRN [id number]  PCP:  Harlan Stains, MD  Cardiologist:  Dr. Harrington Challenger  Chief Complaint: 5  Months follow up  History of Present Illness:   Rebecca Dalton is a 58 y.o. female with hx of CAD s/p DES to LAD, HTN, HLD and ongoing tobacco smoking presents for follow up.   She has had high risk stress test 09/2016. Follow up cath showed 99% LAD lesion s/p PTCA and DES. Normal LV function. Brillinta switched to Effient due to breathing issue. Later changed to Plavix with plan to keep long term along with aspirin.   Patient is schedule for lumber spine injection. Pending recommendations from Dr. Harrington Challenger regarding DAPT.   She is here today for routine follow up.  No concern.  Limited activity due to back pain however still able to do household chores.  She does push mowing lawn for 10 minutes without any limitation.  No chest pain, shortness of breath, dizziness, orthopnea, PND, syncope, lower extremity edema or melena.   Past Medical History:  Diagnosis Date  . Abnormal cardiovascular stress test 10/07/2016  . Anxiety   . CAD in native artery 10/07/2016  . Chronic bronchitis ([place])   . Coronary artery disease   . GERD (gastroesophageal reflux disease)   . HLD (hyperlipidemia) 10/07/2016  . HTN (hypertension) 10/07/2016  . Hypertension   . Pneumonia    "twice" (10/06/2016)  . S/P angioplasty with stent, 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/06/16 10/07/2016    Past Surgical History:  Procedure Laterality Date  . ANTERIOR CERVICAL DECOMP/DISCECTOMY FUSION  ~ 2005   C6-7  . BACK SURGERY    . CORONARY ANGIOPLASTY WITH STENT PLACEMENT  10/06/2016  . CORONARY STENT INTERVENTION N/A 10/06/2016   Procedure: Coronary Stent Intervention;  Surgeon: Burnell Blanks, MD;  Location: [place] [facility];  Service: Cardiovascular;  Laterality: N/A;  . ELBOW ARTHROSCOPY Right 05/03/2016   Procedure: RIGHT ARTHROSCOPY ELBOW WITH REMOVAL FOREIGN  BODIES;  Surgeon: Leanora Cover, MD;  Location: [place];  Service: Orthopedics;  Laterality: Right;  . LEFT HEART CATH AND CORONARY ANGIOGRAPHY N/A 10/06/2016   Procedure: Left Heart Cath and Coronary Angiography;  Surgeon: Burnell Blanks, MD;  Location: [place] [facility];  Service: Cardiovascular;  Laterality: N/A;  . REFRACTIVE SURGERY Bilateral   . TONSILLECTOMY      Current Medications: Prior to Admission medications   Medication Sig Start Date End Date Taking? Authorizing Provider  amLODipine (NORVASC) 2.5 MG tablet Take 2.5 mg by mouth daily.    [provider]  aspirin EC 81 MG tablet Take 1 tablet (81 mg total) by mouth daily. 09/29/16   Fay Records, MD  atorvastatin (LIPITOR) 80 MG tablet TAKE 1 TABLET BY MOUTH DAILY AT 6 PM. 11/26/18   Fay Records, MD  BIOTIN PO Take 1 tablet by mouth daily.    [provider]  carvedilol (COREG) 25 MG tablet Take 25 mg by mouth 2 (two) times daily with a meal.    [provider]  Cholecalciferol (VITAMIN D PO) Take 3 capsules by mouth once a week.    [provider]  clopidogrel (PLAVIX) 75 MG tablet Take 1 tablet (75 mg total) by mouth daily. 12/10/18   Fay Records, MD  cyclobenzaprine (FLEXERIL) 10 MG tablet Take 5-10 mg by mouth daily as needed for muscle spasms 01/11/16   [provider]  hydrochlorothiazide (MICROZIDE) 12.5 MG capsule Take 12.5 mg by mouth daily.    [provider]  ketotifen (ZADITOR) 0.025 % ophthalmic solution Place 1 drop into both eyes as needed (for allergies).    [provider]  LORazepam (ATIVAN) 0.5 MG tablet Take 0.5 mg by mouth daily as needed.     [provider]  MAGNESIUM PO Take 1 tablet by mouth 4 (four) times a week.    [provider]  nitroGLYCERIN (NITROSTAT) 0.4 MG SL tablet Place 1 tablet (0.4 mg total) under the tongue every 5 (five) minutes as needed for chest pain. 10/07/16   Isaiah Serge, NP   olmesartan (BENICAR) 40 MG tablet Take 40 mg by mouth daily.    [provider]  polyethylene glycol (MIRALAX / GLYCOLAX) packet Take 17 g by mouth daily.     [provider]  Probiotic Product (PROBIOTIC PO) Take 1 capsule by mouth daily.    [provider]    Allergies:   Penicillin g, Penicillins, Betamethasone, Codeine, Sulfa antibiotics, Sulfamethoxazole, Sulfasalazine, and Latex   Social History   Socioeconomic History  . Marital status: Single    Spouse name: Not on file  . Number of children: Not on file  . Years of education: Not on file  . Highest education level: Not on file  Occupational History  . Not on file  Social Needs  . Financial resource strain: Not on file  . Food insecurity    Worry: Not on file    Inability: Not on file  . Transportation needs    Medical: Not on file    Non-medical: Not on file  Tobacco Use  . Smoking status: Light Tobacco Smoker    Packs/day: 0.50    Years: 39.00    Pack years: 19.50    Last attempt to quit: 10/06/2016    Years since quitting: 2.5  . Smokeless tobacco: Never Used  Substance and Sexual Activity  . Alcohol use: Yes    Alcohol/week: 21.0 standard drinks    Types: 6 Cans of beer, 15 Shots of liquor per week    Comment: 10/06/2016 "3, 1 shot mixed drinks or 3 beers/day; mostly mixed drinks"  . Drug use: No  . Sexual activity: Not Currently  Lifestyle  . Physical activity    Days per week: Not on file    Minutes per session: Not on file  . Stress: Not on file  Relationships  . Social Herbalist on phone: Not on file    Gets together: Not on file    Attends religious service: Not on file    Active member of club or organization: Not on file    Attends meetings of clubs or organizations: Not on file    Relationship status: Not on file  Other Topics Concern  . Not on file  Social History Narrative  . Not on file     Family History:  The patient's family history includes  Diabetes in her sister; Heart attack (age of onset: 73) in her sister; Heart attack (age of onset: 37) in her maternal grandmother; Heart attack (age of onset: 68) in her brother; Heart attack (age of onset: 30) in her father; Heart disease in her father.   ROS:   Please see the history of present illness.    ROS All other systems reviewed and are negative.   PHYSICAL EXAM:   VS:  BP 122/78   Pulse  70   Ht 5\' 3"  (1.6 m)   Wt 169 lb 12.8 oz (77 kg)   SpO2 98%   BMI 30.08 kg/m    GEN: Well nourished, well developed, in no acute distress  HEENT: normal  Neck: no JVD, carotid bruits, or masses Cardiac: RRR; no murmurs, rubs, or gallops,no edema  Respiratory:  clear to auscultation bilaterally, normal work of breathing GI: soft, nontender, nondistended, + BS MS: no deformity or atrophy  Skin: warm and dry, no rash Neuro:  Alert and Oriented x 3, Strength and sensation are intact Psych: euthymic mood, full affect  Wt Readings from Last 3 Encounters:  05/07/19 169 lb 12.8 oz (77 kg)  12/10/18 166 lb (75.3 kg)  06/06/18 166 lb (75.3 kg)      Studies/Labs Reviewed:   EKG:  EKG is ordered today.  The ekg ordered today demonstrates normal sinus rhythm at rate of 70 bpm  Recent Labs: No results found for requested labs within last 8760 hours.   Lipid Panel    Component Value Date/Time   CHOL 143 06/22/2017 1040   TRIG 143 06/22/2017 1040   HDL 54 06/22/2017 1040   CHOLHDL 2.6 06/22/2017 1040   LDLCALC 60 06/22/2017 1040    Additional studies/ records that were reviewed today include:   Coronary Stent Intervention  09/2016  Left Heart Cath and Coronary Angiography  Conclusion    Mid RCA lesion, 20 %stenosed.  Mid RCA to Dist RCA lesion, 10 %stenosed.  Ost Cx to Prox Cx lesion, 20 %stenosed.  Ost Ramus to Ramus lesion, 20 %stenosed.  A STENT RESOLUTE ONYX 3.0X15 drug eluting stent was successfully placed.  Prox LAD to Mid LAD lesion, 99 %stenosed.  Post  intervention, there is a 0% residual stenosis.  The left ventricular systolic function is normal.  LV end diastolic pressure is normal.  The left ventricular ejection fraction is greater than 65% by visual estimate.  There is no mitral valve regurgitation.   1. Unstable angina  2. Severe stenosis mid LAD 3. Mild non-obstructive disease in the RCA and Circumflex 4. Normal LV systolic function 5. Successful PTCA/DES x 1 mid LAD  Recommendations: She will need DAPT with ASA and Brilinta for one year. I will start a statin. Continue beta blocker. Discharge in am.    Diagnostic Dominance: Right  Intervention      ASSESSMENT & PLAN:    1. CAD No angina.  Continue aspirin, Plavix, statin and beta-blocker.  2.  Hyperlipidemia -Followed by PCP.  Due for lab, she will reach out to her PCP. -Continue Lipitor 80 mg daily.  3.  Hypertension -Blood pressure stable on current medication.  4.  Surgical clearance -She is easily getting greater than 4 METS of activity without chest pain or dyspnea. Given past medical history and time since last visit, based on ACC/AHA guidelines, DEBERAH STROPE would be at acceptable risk for the planned procedure without further cardiovascular testing.  Waiting antiplatelet recommendation from Dr. Harrington Challenger.    Medication Adjustments/Labs and Tests Ordered: Current medicines are reviewed at length with the patient today.  Concerns regarding medicines are outlined above.  Medication changes, Labs and Tests ordered today are listed in the Patient Instructions below. Patient Instructions  Medication Instructions:  Your physician recommends that you continue on your current medications as directed. Please refer to the Current Medication list given to you today.  If you need a refill on your cardiac medications before your next appointment, please call your  pharmacy.   Lab work: None ordered  If you have labs (blood work) drawn today and your tests are  completely normal, you will receive your results only by: Marland Kitchen MyChart Message (if you have MyChart) OR . A paper copy in the mail If you have any lab test that is abnormal or we need to change your treatment, we will call you to review the results.  Testing/Procedures: None ordered  Follow-Up: At [facility], you and your health needs are our priority.  As part of our continuing mission to provide you with exceptional heart care, we have created designated Provider Care Teams.  These Care Teams include your primary Cardiologist (physician) and Advanced Practice Providers (APPs -  Physician Assistants and Nurse Practitioners) who all work together to provide you with the care you need, when you need it. You will need a follow up appointment in:  6 months.  Please call our office 2 months in advance to schedule this appointment.  You may see Dorris Carnes, MD or one of the following Advanced Practice Providers on your designated Care Team: Richardson Dopp, PA-C [place], [place] . Daune Perch, NP  Any Other Special Instructions Will Be Listed Below (If Applicable).       Jarrett Soho, [place]  05/07/2019 3:39 PM    [facility] [address], [place], [place]  [address] Phone: [phone]; Fax: [phone]

## 2019-05-07 NOTE — Telephone Encounter (Signed)
   Reviewed Chart. Patient with DES to LAD in 09/2016.   Last seen by Dr. Harrington Challenger 12/10/2018.  Will route to Dr. Harrington Challenger to address holding ASA and Plavix for 7 days prior to [facility] Then, patient will need phone call. Richardson Dopp, PA-C    05/07/2019 11:14 AM

## 2019-05-08 NOTE — Telephone Encounter (Signed)
   Primary Cardiologist: Dorris Carnes, MD  Chart reviewed as part of pre-operative protocol coverage. Given past medical history and time since last visit, based on ACC/AHA guidelines, Rebecca Dalton would be at acceptable risk for the planned procedure without further cardiovascular testing.   Per Dr. Harrington Challenger "Patent with intervention in 2018   OK to hold ASA and PLavix   WOuld resume post surgery when safe".   I will route this recommendation to the requesting party via Epic fax function and remove from pre-op pool.  Please call with questions.  [place], [place] 05/08/2019, 8:09 AM

## 2019-05-25 ENCOUNTER — Other Ambulatory Visit: Payer: Self-pay | Admitting: Internal Medicine

## 2019-05-30 ENCOUNTER — Telehealth: Payer: Self-pay | Admitting: *Deleted

## 2019-05-30 NOTE — Telephone Encounter (Signed)
   [facility] Pre-operative Risk Assessment    Request for surgical clearance:  1. What type of surgery is being performed? LUMBAR SPINE INJECTION   2. When is this surgery scheduled? TBD   3. What type of clearance is required (medical clearance vs. Pharmacy clearance to hold med vs. Both)? MEDICAL  4. Are there any medications that need to be held prior to surgery and how long? PLAVIX AND ASA TO BE HELD X 7 DAYS PRIOR TO INJECTIO   5. Practice name and name of physician performing surgery? [place]; DR/ [place]   6. What is your office phone number [phone]    7.   What is your office fax number [phone]  8.   Anesthesia type (None, local, MAC, general) ? NONE LISTED   Rebecca Dalton 05/30/2019, 5:41 PM  _________________________________________________________________   (provider comments below)

## 2019-06-03 NOTE — Telephone Encounter (Signed)
   Primary Cardiologist: Dorris Carnes, MD  Chart reviewed as part of pre-operative protocol coverage. Given past medical history and time since last visit, based on ACC/AHA guidelines, Rebecca Dalton would be at acceptable risk for the planned procedure without further cardiovascular testing.   Patient was last seen on 05/07/2019. At that time, Dr. Harrington Challenger noted that her cardiac intervention was in 2018 and therefore was thought to be at acceptable risk to hold ASA and Plavix for up to 7 days prior to lumbar spine injection then resume post-procedureal when safe per surgical team.    I will route this recommendation to the requesting party via [facility] fax function and remove from pre-op pool.  Please call with questions.  Kathyrn Drown, NP 06/03/2019, 12:19 PM

## 2019-06-05 NOTE — Telephone Encounter (Signed)
Our office received a 2nd clearance request today. I called and left message clearance was faxed over 06/03/19. Please call the office if you have not received clearance.

## 2019-06-06 ENCOUNTER — Telehealth: Payer: Self-pay | Admitting: Internal Medicine

## 2019-06-06 NOTE — Telephone Encounter (Signed)
Sent 2 previous clearance notes to that fax number

## 2019-06-06 NOTE — Telephone Encounter (Signed)
Follow Up:    Shanita from Dr Brien Few called back. She said the clearance had not been received. She said her Supervisor faxed another one over yesterday. Please fax this back to [phone].

## 2019-08-28 ENCOUNTER — Telehealth: Payer: Self-pay

## 2019-08-28 NOTE — Telephone Encounter (Signed)
   [facility] Pre-operative Risk Assessment    Request for surgical clearance:  1. What type of surgery is being performed? REMOVING BONE FROM L4, 5 AND S1   2. When is this surgery scheduled?09/06/19  3. What type of clearance is required (medical clearance vs. Pharmacy clearance to hold med vs. Both)? PHARMACY  4. Are there any medications that need to be held prior to surgery and how long? PLAVIX AND ASA X7 DAYS   5. Practice name and name of physician performing surgery? [facility]; DR. Ronnald Ramp    6. What is your office phone number [phone]    7.   What is your office fax number [phone]; ATTN: JESSICA  8.   Anesthesia type (None, local, MAC, general) ? GENERAL    Rebecca Dalton 08/28/2019, 3:42 PM  _________________________________________________________________   (provider comments below)

## 2019-08-28 NOTE — Telephone Encounter (Signed)
   Primary Cardiologist: Dorris Carnes, MD  Chart reviewed as part of pre-operative protocol coverage. Per Dr. Harrington Challenger for prior preop assessment, and given lack of interval change in cardiac history, patient can hold aspirin and plavix 7 days prior to his upcoming spinal surgery. He should restart aspirin and plavix when cleared to do so by Dr. Ronnald Ramp.  I will route this recommendation to the requesting party via Epic fax function and remove from pre-op pool.  Please call with questions.  Abigail Butts, PA-C 08/28/2019, 4:41 PM

## 2019-11-07 ENCOUNTER — Encounter: Payer: Self-pay | Admitting: Internal Medicine

## 2019-11-07 ENCOUNTER — Other Ambulatory Visit: Payer: Self-pay

## 2019-11-07 ENCOUNTER — Ambulatory Visit: Payer: Medicare Other | Admitting: Internal Medicine

## 2019-11-07 VITALS — BP 124/76 | HR 80 | Ht 63.0 in | Wt 174.0 lb

## 2019-11-07 DIAGNOSIS — I10 Essential (primary) hypertension: Secondary | ICD-10-CM

## 2019-11-07 DIAGNOSIS — E782 Mixed hyperlipidemia: Secondary | ICD-10-CM | POA: Diagnosis not present

## 2019-11-07 LAB — BASIC METABOLIC PANEL
BUN/Creatinine Ratio: 15 (ref 12–28)
BUN: 10 mg/dL (ref 8–27)
CO2: 27 mmol/L (ref 20–29)
Calcium: 9.9 mg/dL (ref 8.7–10.3)
Chloride: 98 mmol/L (ref 96–106)
Creatinine, Ser: 0.65 mg/dL (ref 0.57–1.00)
GFR calc Af Amer: 112 mL/min/{1.73_m2} (ref >59)
GFR calc non Af Amer: 97 mL/min/{1.73_m2} (ref >59)
Glucose: 108 mg/dL — ABNORMAL HIGH (ref 65–99)
Potassium: 4.3 mmol/L (ref 3.5–5.2)
Sodium: 138 mmol/L (ref 134–144)

## 2019-11-07 LAB — CBC
Hematocrit: 39.3 % (ref 34.0–46.6)
Hemoglobin: 13.8 g/dL (ref 11.1–15.9)
MCH: 34.9 pg — ABNORMAL HIGH (ref 26.6–33.0)
MCHC: 35.1 g/dL (ref 31.5–35.7)
MCV: 100 fL — ABNORMAL HIGH (ref 79–97)
Platelets: 188 10*3/uL (ref 150–450)
RBC: 3.95 x10E6/uL (ref 3.77–5.28)
RDW: 12.4 % (ref 11.7–15.4)
WBC: 6.4 10*3/uL (ref 3.4–10.8)

## 2019-11-07 LAB — LIPID PANEL
Chol/HDL Ratio: 3.1 ratio (ref 0.0–4.4)
Cholesterol, Total: 127 mg/dL (ref 100–199)
HDL: 41 mg/dL (ref >39)
LDL Chol Calc (NIH): 59 mg/dL (ref 0–99)
Triglycerides: 155 mg/dL — ABNORMAL HIGH (ref 0–149)
VLDL Cholesterol Cal: 27 mg/dL (ref 5–40)

## 2019-11-07 NOTE — Progress Notes (Signed)
Cardiology Office Note   Date:  11/07/2019   ID:  Rebecca Dalton, DOB August 09, 1961, MRN [id number]  PCP:  Harlan Stains, MD  Cardiologist:   Dorris Carnes, MD   Patient presents for follow-up with CAD    History of Present Illness: Rebecca Dalton is a 59 y.o. female with a history ofCAD s/p DES to LAD, HTN, HLD and ongoing tobacco smoking presents for follow up.   She has had high risk stress test 09/2016. Follow up cath showed 99% LAD lesion s/p PTCA and DES. Normal LV function. Brillinta switched to Effient due to breathing issue. Later changed to Plavix with plan to keep long term along with aspirin.   I saw her in April   She was seen by B Bhagat in November 2020 Patient says it has been a difficult year.  She had surgery on her back after trial of injections.  She still has problems with pain down her left leg and tingling sensations weakness.  She is starting to do PT.  She denies chest pain.  Breathing is okay.  Again activity limited by leg.    Current Meds  Medication Sig  . amLODipine (NORVASC) 2.5 MG tablet Take 2.5 mg by mouth daily.  Marland Kitchen aspirin EC 81 MG tablet Take 1 tablet (81 mg total) by mouth daily.  Marland Kitchen atorvastatin (LIPITOR) 80 MG tablet TAKE 1 TABLET BY MOUTH DAILY AT 6 PM.  . BIOTIN PO Take 1 tablet by mouth daily.  . carvedilol (COREG) 25 MG tablet Take 25 mg by mouth 2 (two) times daily with a meal.  . Cholecalciferol (VITAMIN D PO) Take 3 capsules by mouth once a week.  . clopidogrel (PLAVIX) 75 MG tablet Take 1 tablet (75 mg total) by mouth daily.  . cyclobenzaprine (FLEXERIL) 10 MG tablet Take 5-10 mg by mouth daily as needed for muscle spasms  . hydrochlorothiazide (MICROZIDE) 12.5 MG capsule Take 12.5 mg by mouth daily.  Marland Kitchen ketotifen (ZADITOR) 0.025 % ophthalmic solution Place 1 drop into both eyes as needed (for allergies).  . LORazepam (ATIVAN) 0.5 MG tablet Take 0.5 mg by mouth daily as needed.   Marland Kitchen MAGNESIUM PO Take 1 tablet by mouth 4 (four) times a  week.  . nitroGLYCERIN (NITROSTAT) 0.4 MG SL tablet Place 1 tablet (0.4 mg total) under the tongue every 5 (five) minutes as needed for chest pain.  Marland Kitchen olmesartan (BENICAR) 40 MG tablet Take 40 mg by mouth daily.  . polyethylene glycol (MIRALAX / GLYCOLAX) packet Take 17 g by mouth daily.   . Probiotic Product (PROBIOTIC PO) Take 1 capsule by mouth daily.     Allergies:   Penicillin g, Penicillins, Betamethasone, Codeine, Sulfa antibiotics, Sulfamethoxazole, Sulfasalazine, and Latex   Past Medical History:  Diagnosis Date  . Abnormal cardiovascular stress test 10/07/2016  . Anxiety   . CAD in native artery 10/07/2016  . Chronic bronchitis ([place])   . Coronary artery disease   . GERD (gastroesophageal reflux disease)   . HLD (hyperlipidemia) 10/07/2016  . HTN (hypertension) 10/07/2016  . Hypertension   . Pneumonia    "twice" (10/06/2016)  . S/P angioplasty with stent, 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/06/16 10/07/2016    Past Surgical History:  Procedure Laterality Date  . ANTERIOR CERVICAL DECOMP/DISCECTOMY FUSION  ~ 2005   C6-7  . BACK SURGERY    . CORONARY ANGIOPLASTY WITH STENT PLACEMENT  10/06/2016  . CORONARY STENT INTERVENTION N/A 10/06/2016   Procedure: Coronary Stent Intervention;  Surgeon: Harrell Gave  Santina Evans, MD;  Location: [facility];  Service: Cardiovascular;  Laterality: N/A;  . ELBOW ARTHROSCOPY Right 05/03/2016   Procedure: RIGHT ARTHROSCOPY ELBOW WITH REMOVAL FOREIGN BODIES;  Surgeon: Leanora Cover, MD;  Location: [facility];  Service: Orthopedics;  Laterality: Right;  . LEFT HEART CATH AND CORONARY ANGIOGRAPHY N/A 10/06/2016   Procedure: Left Heart Cath and Coronary Angiography;  Surgeon: Burnell Blanks, MD;  Location: [facility];  Service: Cardiovascular;  Laterality: N/A;  . REFRACTIVE SURGERY Bilateral   . TONSILLECTOMY       Social History:  The patient  reports that she has been smoking. She has a 19.50 pack-year smoking history. She has never  used smokeless tobacco. She reports current alcohol use of about 21.0 standard drinks of alcohol per week. She reports that she does not use drugs.   Family History:  The patient's family history includes Diabetes in her sister; Heart attack (age of onset: 2) in her sister; Heart attack (age of onset: 36) in her maternal grandmother; Heart attack (age of onset: 59) in her brother; Heart attack (age of onset: 32) in her father; Heart disease in her father.    ROS:  Please see the history of present illness. All other systems are reviewed and  Negative to the above problem except as noted.    PHYSICAL EXAM: VS:  BP 124/76   Pulse 80   Ht _0  (1.6 m)   Wt 174 lb (78.9 kg)   SpO2 98%   BMI 30.82 kg/m   GEN: Well nourished, well developed, in no acute distress  HEENT: normal  Neck: no JVD, carotid bruits Cardiac: RRR; no murmurs, rubs, or gallops,no LE edema  Respiratory:  clear to auscultation bilaterally, normal work of breathing GI: soft, nontender, nondistended, + BS  No hepatomegaly  MS: no deformity Moving all extremities   Skin: warm and dry, no rash Neuro:  Strength and sensation are intact Psych: euthymic mood, full affect   EKG:  EKG is not ordered today.   Lipid Panel    Component Value Date/Time   CHOL 143 06/22/2017 1040   TRIG 143 06/22/2017 1040   HDL 54 06/22/2017 1040   CHOLHDL 2.6 06/22/2017 1040   LDLCALC 60 06/22/2017 1040      Wt Readings from Last 3 Encounters:  11/07/19 174 lb (78.9 kg)  05/07/19 169 lb 12.8 oz (77 kg)  12/10/18 166 lb (75.3 kg)      ASSESSMENT AND PLAN:  1 CAD.  Patient with intervention a few years ago to the LAD   Currently asymptomatic.  Will review labs and films to decide long-term use of aspirin Plavix.  2 dyslipidemia we will check lipids today.  3.  Hypertension.  Patient on carvedilol and olmesartan and HCTZ.  Blood pressure is good check labs.  I will set to see the patient back in December again CBC, B met,  lipid.   Current medicines are reviewed at length with the patient today.  The patient does not have concerns regarding medicines.  Signed, Dorris Carnes, MD  11/07/2019 10:45 AM    [address], [place], [place]  [address] Phone: [phone]; Fax: [phone]

## 2019-11-07 NOTE — Patient Instructions (Signed)
Medication Instructions:  No changes *If you need a refill on your cardiac medications before your next appointment, please call your pharmacy*   Lab Work: Today: cbc, bmet, lipid  If you have labs (blood work) drawn today and your tests are completely normal, you will receive your results only by: Marland Kitchen MyChart Message (if you have MyChart) OR . A paper copy in the mail If you have any lab test that is abnormal or we need to change your treatment, we will call you to review the results.   Testing/Procedures: none   Follow-Up: At [facility], you and your health needs are our priority.  As part of our continuing mission to provide you with exceptional heart care, we have created designated Provider Care Teams.  These Care Teams include your primary Cardiologist (physician) and Advanced Practice Providers (APPs -  Physician Assistants and Nurse Practitioners) who all work together to provide you with the care you need, when you need it.  Your next appointment:   9 month(s)  The format for your next appointment:   Either In Person or Virtual  Provider:   You may see Dorris Carnes, MD or one of the following Advanced Practice Providers on your designated Care Team:    Richardson Dopp, PA-C  [place], [place]  Daune Perch, NP    Other Instructions

## 2019-12-01 ENCOUNTER — Other Ambulatory Visit: Payer: Self-pay | Admitting: Internal Medicine

## 2020-05-31 ENCOUNTER — Other Ambulatory Visit: Payer: Self-pay | Admitting: Internal Medicine

## 2020-07-28 ENCOUNTER — Other Ambulatory Visit: Payer: Self-pay | Admitting: Physician Assistant

## 2020-07-28 DIAGNOSIS — R748 Abnormal levels of other serum enzymes: Secondary | ICD-10-CM

## 2020-08-03 ENCOUNTER — Telehealth: Payer: Self-pay

## 2020-08-03 NOTE — Telephone Encounter (Signed)
   [facility] Pre-operative Risk Assessment    HEARTCARE STAFF: - Please ensure there is not already an duplicate clearance open for this procedure. - Under Visit Info/Reason for Call, type in Other and utilize the format Clearance MM/DD/YY or Clearance TBD. Do not use dashes or single digits. - If request is for dental extraction, please clarify the # of teeth to be extracted.  Request for surgical clearance:  1. What type of surgery is being performed? Colonoscopy   2. When is this surgery scheduled? 09/17/20   3. What type of clearance is required (medical clearance vs. Pharmacy clearance to hold med vs. Both)? Pharmacy  4. Are there any medications that need to be held prior to surgery and how long? Plavix   5. Practice name and name of physician performing surgery? [facility], Arta Silence, MD  6. What is the office phone number? [phone]   7.   What is the office fax number? [phone]  8.   Anesthesia type (None, local, MAC, general) ? Propofol    Mady Haagensen 08/03/2020, 3:25 PM  _________________________________________________________________   (provider comments below)

## 2020-08-04 NOTE — Telephone Encounter (Addendum)
   Primary Cardiologist: Dorris Carnes, MD  Chart reviewed as part of pre-operative protocol coverage.   Pt has previously been cleared to hold plavix for procedures. No interval change in her cardiac history. OK to hold plavix for 5-7 days for colonoscopy.  I will route this recommendation to the requesting party via Epic fax function and remove from pre-op pool. Please call with questions.  Tami Lin Raford Brissett, PA 08/04/2020, 10:59 AM

## 2020-08-04 NOTE — Telephone Encounter (Signed)
   Pt is returning Angie's call

## 2020-08-17 ENCOUNTER — Other Ambulatory Visit: Payer: Self-pay

## 2020-08-17 ENCOUNTER — Ambulatory Visit
Admission: RE | Admit: 2020-08-17 | Discharge: 2020-08-17 | Disposition: A | Discharge status: Home / Self Care | Payer: Medicare Other | Source: Ambulatory Visit | Attending: Physician Assistant | Admitting: Physician Assistant

## 2020-08-17 DIAGNOSIS — R748 Abnormal levels of other serum enzymes: Secondary | ICD-10-CM

## 2020-08-17 MED ORDER — IOPAMIDOL (ISOVUE-300) INJECTION 61%
100.0000 mL | Freq: Once | INTRAVENOUS | Status: AC | PRN
  Administered 2020-08-17: 100 mL via INTRAVENOUS

## 2020-09-02 DIAGNOSIS — I10 Essential (primary) hypertension: Secondary | ICD-10-CM | POA: Diagnosis not present

## 2020-09-02 DIAGNOSIS — D696 Thrombocytopenia, unspecified: Secondary | ICD-10-CM | POA: Diagnosis not present

## 2020-09-02 DIAGNOSIS — E559 Vitamin D deficiency, unspecified: Secondary | ICD-10-CM | POA: Diagnosis not present

## 2020-09-02 DIAGNOSIS — R945 Abnormal results of liver function studies: Secondary | ICD-10-CM | POA: Diagnosis not present

## 2020-09-02 DIAGNOSIS — E1169 Type 2 diabetes mellitus with other specified complication: Secondary | ICD-10-CM | POA: Diagnosis not present

## 2020-09-02 DIAGNOSIS — M5440 Lumbago with sciatica, unspecified side: Secondary | ICD-10-CM | POA: Diagnosis not present

## 2020-09-02 DIAGNOSIS — F17200 Nicotine dependence, unspecified, uncomplicated: Secondary | ICD-10-CM | POA: Diagnosis not present

## 2020-09-02 DIAGNOSIS — E785 Hyperlipidemia, unspecified: Secondary | ICD-10-CM | POA: Diagnosis not present

## 2020-09-02 DIAGNOSIS — R748 Abnormal levels of other serum enzymes: Secondary | ICD-10-CM | POA: Diagnosis not present

## 2020-09-10 NOTE — Progress Notes (Signed)
Cardiology Office Note   Date:  09/11/2020   ID:  Rebecca Dalton, DOB 09-Mar-1961, MRN [id number]  PCP:  Harlan Stains, MD  Cardiologist:   Dorris Carnes, MD   Patient presents for follow-up with CAD    History of Present Illness: Rebecca Dalton is a 60 y.o. female with a history ofCAD s/p DES to LAD, HTN, HLD   She has had high risk stress test 09/2016. Follow up cath showed 99% LAD lesion s/p PTCA and DES. Normal LV function. Brillinta switched to Effient due to breathing issue. Later changed to Plavix with plan to keep long term along with aspirin.   I saw her in clinic in March 2021  Since seen she denies CP  Breathing is OK  No dizziness Activity limited by back   DIet:  Breakfast:  Wheat chex / fruit Lunch:  Lean cuisine Dinner:  Chicken vegatbles   Current Meds  Medication Sig  . amLODipine (NORVASC) 2.5 MG tablet Take 2.5 mg by mouth daily.  Marland Kitchen aspirin EC 81 MG tablet Take 1 tablet (81 mg total) by mouth daily.  Marland Kitchen atorvastatin (LIPITOR) 80 MG tablet TAKE 1 TABLET BY MOUTH DAILY AT 6 PM.  . BIOTIN PO Take 1 tablet by mouth daily.  . carvedilol (COREG) 25 MG tablet Take 25 mg by mouth 2 (two) times daily with a meal.  . Cholecalciferol (VITAMIN D PO) Take 3 capsules by mouth once a week.  . clopidogrel (PLAVIX) 75 MG tablet TAKE 1 TABLET BY MOUTH EVERY DAY  . cyclobenzaprine (FLEXERIL) 10 MG tablet Take 5-10 mg by mouth daily as needed for muscle spasms  . hydrochlorothiazide (MICROZIDE) 12.5 MG capsule Take 12.5 mg by mouth daily.  Marland Kitchen ketotifen (ZADITOR) 0.025 % ophthalmic solution Place 1 drop into both eyes as needed (for allergies).  . LORazepam (ATIVAN) 0.5 MG tablet Take 0.5 mg by mouth daily as needed.   Marland Kitchen MAGNESIUM PO Take 1 tablet by mouth 4 (four) times a week.  . nitroGLYCERIN (NITROSTAT) 0.4 MG SL tablet Place 1 tablet (0.4 mg total) under the tongue every 5 (five) minutes as needed for chest pain.  Marland Kitchen olmesartan (BENICAR) 40 MG tablet Take 40 mg by mouth  daily.  . polyethylene glycol (MIRALAX / GLYCOLAX) packet Take 17 g by mouth daily.   . Probiotic Product (PROBIOTIC PO) Take 1 capsule by mouth daily.     Allergies:   Penicillin g, Penicillins, Betamethasone, Codeine, Sulfa antibiotics, Sulfamethoxazole, Sulfasalazine, and Latex   Past Medical History:  Diagnosis Date  . Abnormal cardiovascular stress test 10/07/2016  . Anxiety   . CAD in native artery 10/07/2016  . Chronic bronchitis ([place])   . Coronary artery disease   . GERD (gastroesophageal reflux disease)   . HLD (hyperlipidemia) 10/07/2016  . HTN (hypertension) 10/07/2016  . Hypertension   . Pneumonia    "twice" (10/06/2016)  . S/P angioplasty with stent, 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/06/16 10/07/2016    Past Surgical History:  Procedure Laterality Date  . ANTERIOR CERVICAL DECOMP/DISCECTOMY FUSION  ~ 2005   C6-7  . BACK SURGERY    . CORONARY ANGIOPLASTY WITH STENT PLACEMENT  10/06/2016  . CORONARY STENT INTERVENTION N/A 10/06/2016   Procedure: Coronary Stent Intervention;  Surgeon: Burnell Blanks, MD;  Location: [facility];  Service: Cardiovascular;  Laterality: N/A;  . ELBOW ARTHROSCOPY Right 05/03/2016   Procedure: RIGHT ARTHROSCOPY ELBOW WITH REMOVAL FOREIGN BODIES;  Surgeon: Leanora Cover, MD;  Location: [facility];  Service: Orthopedics;  Laterality: Right;  . LEFT HEART CATH AND CORONARY ANGIOGRAPHY N/A 10/06/2016   Procedure: Left Heart Cath and Coronary Angiography;  Surgeon: Burnell Blanks, MD;  Location: [facility];  Service: Cardiovascular;  Laterality: N/A;  . REFRACTIVE SURGERY Bilateral   . TONSILLECTOMY       Social History:  The patient  reports that she has been smoking. She has a 19.50 pack-year smoking history. She has never used smokeless tobacco. She reports current alcohol use of about 21.0 standard drinks of alcohol per week. She reports that she does not use drugs.   Family History:  The patient's family history includes  Diabetes in her sister; Heart attack (age of onset: 86) in her sister; Heart attack (age of onset: 41) in her maternal grandmother; Heart attack (age of onset: 18) in her brother; Heart attack (age of onset: 60) in her father; Heart disease in her father.    ROS:  Please see the history of present illness. All other systems are reviewed and  Negative to the above problem except as noted.    PHYSICAL EXAM: VS:  BP 122/76   Pulse 82   Ht 5\' 3"  (1.6 m)   Wt 174 lb 9.6 oz (79.2 kg)   SpO2 97%   BMI 30.93 kg/m   GEN: Obese 60 yo in no acute distress  HEENT: normal  Neck: no JVD, No carotid bruits Cardiac: RRR; no murmurs,no LE edema  Respiratory:  clear to auscultation bilaterally, GI: soft, nontender, nondistended, + BS  No hepatomegaly  MS: no deformity Moving all extremities   Skin: warm and dry, no rash Neuro:  Strength and sensation are intact Psych: euthymic mood, full affect   EKG:  EKG is not ordered today.   Lipid Panel    Component Value Date/Time   CHOL 127 11/07/2019 1105   TRIG 155 (H) 11/07/2019 1105   HDL 41 11/07/2019 1105   CHOLHDL 3.1 11/07/2019 1105   LDLCALC 59 11/07/2019 1105      Wt Readings from Last 3 Encounters:  09/11/20 174 lb 9.6 oz (79.2 kg)  11/07/19 174 lb (78.9 kg)  05/07/19 169 lb 12.8 oz (77 kg)      ASSESSMENT AND PLAN:  1 CAD.  Patient with intervention to LAD  REmains on ASA and plavix given location of statin   Follow CBC  2 dyslipidemia   LIpids in March 2021   LDL 59  HDL 41  Trig 155   Pt says she has had labs at Dr Erlinda Hong office   WIll check    3.  Hypertension. BP is controlled   4  Obesity   Discussed diet   Try to cut back on sugar; consider Time Restricted Eating   F/U in [place]to[place]   Current medicines are reviewed at length with the patient today.  The patient does not have concerns regarding medicines.  Signed, Dorris Carnes, MD  09/11/2020 11:28 AM    [address],  [place], [place]  [address] Phone: [phone]; Fax: [phone]

## 2020-09-11 ENCOUNTER — Other Ambulatory Visit: Payer: Self-pay

## 2020-09-11 ENCOUNTER — Encounter: Payer: Self-pay | Admitting: Internal Medicine

## 2020-09-11 ENCOUNTER — Ambulatory Visit: Payer: Medicare Other | Admitting: Internal Medicine

## 2020-09-11 VITALS — BP 122/76 | HR 82 | Ht 63.0 in | Wt 174.6 lb

## 2020-09-11 DIAGNOSIS — I2510 Atherosclerotic heart disease of native coronary artery without angina pectoris: Secondary | ICD-10-CM

## 2020-09-11 NOTE — Patient Instructions (Addendum)
Medication Instructions:  No changes today *If you need a refill on your cardiac medications before your next appointment, please call your pharmacy*   Lab Work: none   Testing/Procedures: none   Follow-Up: At [facility], you and your health needs are our priority.  As part of our continuing mission to provide you with exceptional heart care, we have created designated Provider Care Teams.  These Care Teams include your primary Cardiologist (physician) and Advanced Practice Providers (APPs -  Physician Assistants and Nurse Practitioners) who all work together to provide you with the care you need, when you need it.    Your next appointment:   9 month(s)  The format for your next appointment:   In Person  Provider:   You may see Dorris Carnes, MD or one of the following Advanced Practice Providers on your designated Care Team:    Richardson Dopp, PA-C  [place]bie [place], [place]   Other Instructions Will request labs from Dr. Erlinda Hong office.

## 2020-09-14 DIAGNOSIS — Z01812 Encounter for preprocedural laboratory examination: Secondary | ICD-10-CM | POA: Diagnosis not present

## 2020-09-17 ENCOUNTER — Other Ambulatory Visit: Payer: Self-pay | Admitting: Gastroenterology

## 2020-09-17 DIAGNOSIS — K5730 Diverticulosis of large intestine without perforation or abscess without bleeding: Secondary | ICD-10-CM | POA: Diagnosis not present

## 2020-09-17 DIAGNOSIS — R197 Diarrhea, unspecified: Secondary | ICD-10-CM | POA: Diagnosis not present

## 2020-09-17 DIAGNOSIS — K649 Unspecified hemorrhoids: Secondary | ICD-10-CM | POA: Diagnosis not present

## 2020-09-17 DIAGNOSIS — R7989 Other specified abnormal findings of blood chemistry: Secondary | ICD-10-CM

## 2020-09-17 DIAGNOSIS — D122 Benign neoplasm of ascending colon: Secondary | ICD-10-CM | POA: Diagnosis not present

## 2020-09-21 DIAGNOSIS — D122 Benign neoplasm of ascending colon: Secondary | ICD-10-CM | POA: Diagnosis not present

## 2020-10-08 ENCOUNTER — Ambulatory Visit
Admission: RE | Admit: 2020-10-08 | Discharge: 2020-10-08 | Disposition: A | Discharge status: Home / Self Care | Payer: Medicare Other | Source: Ambulatory Visit | Attending: Gastroenterology | Admitting: Gastroenterology

## 2020-10-08 ENCOUNTER — Other Ambulatory Visit: Payer: Self-pay

## 2020-10-08 DIAGNOSIS — R7989 Other specified abnormal findings of blood chemistry: Secondary | ICD-10-CM

## 2020-10-08 DIAGNOSIS — N281 Cyst of kidney, acquired: Secondary | ICD-10-CM | POA: Diagnosis not present

## 2020-10-08 DIAGNOSIS — K7689 Other specified diseases of liver: Secondary | ICD-10-CM | POA: Diagnosis not present

## 2020-10-08 DIAGNOSIS — K449 Diaphragmatic hernia without obstruction or gangrene: Secondary | ICD-10-CM | POA: Diagnosis not present

## 2020-10-08 MED ORDER — GADOBENATE DIMEGLUMINE 529 MG/ML IV SOLN
17.0000 mL | Freq: Once | INTRAVENOUS | Status: AC | PRN
  Administered 2020-10-08: 17 mL via INTRAVENOUS

## 2020-11-05 ENCOUNTER — Other Ambulatory Visit: Payer: Self-pay | Admitting: Internal Medicine

## 2021-01-20 ENCOUNTER — Other Ambulatory Visit: Payer: Self-pay | Admitting: Gastroenterology

## 2021-01-20 DIAGNOSIS — R197 Diarrhea, unspecified: Secondary | ICD-10-CM | POA: Diagnosis not present

## 2021-01-20 DIAGNOSIS — K769 Liver disease, unspecified: Secondary | ICD-10-CM | POA: Diagnosis not present

## 2021-01-20 DIAGNOSIS — R7989 Other specified abnormal findings of blood chemistry: Secondary | ICD-10-CM

## 2021-01-20 DIAGNOSIS — K921 Melena: Secondary | ICD-10-CM | POA: Diagnosis not present

## 2021-02-25 DIAGNOSIS — E785 Hyperlipidemia, unspecified: Secondary | ICD-10-CM | POA: Diagnosis not present

## 2021-02-25 DIAGNOSIS — I700 Atherosclerosis of aorta: Secondary | ICD-10-CM | POA: Diagnosis not present

## 2021-02-25 DIAGNOSIS — E1169 Type 2 diabetes mellitus with other specified complication: Secondary | ICD-10-CM | POA: Diagnosis not present

## 2021-02-25 DIAGNOSIS — D696 Thrombocytopenia, unspecified: Secondary | ICD-10-CM | POA: Diagnosis not present

## 2021-02-25 DIAGNOSIS — I2510 Atherosclerotic heart disease of native coronary artery without angina pectoris: Secondary | ICD-10-CM | POA: Diagnosis not present

## 2021-02-25 DIAGNOSIS — Z1389 Encounter for screening for other disorder: Secondary | ICD-10-CM | POA: Diagnosis not present

## 2021-02-25 DIAGNOSIS — M545 Low back pain, unspecified: Secondary | ICD-10-CM | POA: Diagnosis not present

## 2021-02-25 DIAGNOSIS — M25551 Pain in right hip: Secondary | ICD-10-CM | POA: Diagnosis not present

## 2021-02-25 DIAGNOSIS — Z0000 Encounter for general adult medical examination without abnormal findings: Secondary | ICD-10-CM | POA: Diagnosis not present

## 2021-02-25 DIAGNOSIS — E559 Vitamin D deficiency, unspecified: Secondary | ICD-10-CM | POA: Diagnosis not present

## 2021-02-25 DIAGNOSIS — I10 Essential (primary) hypertension: Secondary | ICD-10-CM | POA: Diagnosis not present

## 2021-03-24 ENCOUNTER — Ambulatory Visit
Admission: RE | Admit: 2021-03-24 | Discharge: 2021-03-24 | Disposition: A | Discharge status: Home / Self Care | Payer: Medicare Other | Source: Ambulatory Visit | Attending: Gastroenterology | Admitting: Gastroenterology

## 2021-03-24 ENCOUNTER — Other Ambulatory Visit: Payer: Self-pay

## 2021-03-24 DIAGNOSIS — K839 Disease of biliary tract, unspecified: Secondary | ICD-10-CM | POA: Diagnosis not present

## 2021-03-24 DIAGNOSIS — R7989 Other specified abnormal findings of blood chemistry: Secondary | ICD-10-CM | POA: Diagnosis not present

## 2021-03-24 DIAGNOSIS — K314 Gastric diverticulum: Secondary | ICD-10-CM | POA: Diagnosis not present

## 2021-03-24 DIAGNOSIS — N281 Cyst of kidney, acquired: Secondary | ICD-10-CM | POA: Diagnosis not present

## 2021-03-24 MED ORDER — GADOBENATE DIMEGLUMINE 529 MG/ML IV SOLN
15.0000 mL | Freq: Once | INTRAVENOUS | Status: AC | PRN
  Administered 2021-03-24: 15 mL via INTRAVENOUS

## 2021-03-25 DIAGNOSIS — Z1231 Encounter for screening mammogram for malignant neoplasm of breast: Secondary | ICD-10-CM | POA: Diagnosis not present

## 2021-03-31 ENCOUNTER — Telehealth: Payer: Self-pay | Admitting: Internal Medicine

## 2021-03-31 NOTE — Telephone Encounter (Signed)
Patient requesting different medication in place of atorvastatin.  For a year she has been having multiple loose stools almost daily and leg pains.  Her last LDL scanned into chart 02/2021 is 44.  She is going to hold atorvastatin x 2 weeks and call w response.  Will restart if no better.  Will route to Dr. Harrington Challenger to make aware.

## 2021-03-31 NOTE — Telephone Encounter (Signed)
Pt c/o medication issue:  1. Name of Medication: Atorvastatin  2. How are you currently taking this medication (dosage and times per day)?  1 times a day  3. Are you having a reaction (difficulty breathing--STAT)? no  4. What is your medication issue? Bowel problems and legs hurting- she wants to stop taking it and try something else

## 2021-03-31 NOTE — Telephone Encounter (Signed)
Call back in a couple of weeks to see how GI tract is, if bowels have normalized   I would not restart or try anything different until this is confirmed

## 2021-06-21 DIAGNOSIS — L820 Inflamed seborrheic keratosis: Secondary | ICD-10-CM | POA: Diagnosis not present

## 2021-06-21 DIAGNOSIS — L538 Other specified erythematous conditions: Secondary | ICD-10-CM | POA: Diagnosis not present

## 2021-06-21 DIAGNOSIS — L821 Other seborrheic keratosis: Secondary | ICD-10-CM | POA: Diagnosis not present

## 2021-06-21 DIAGNOSIS — L814 Other melanin hyperpigmentation: Secondary | ICD-10-CM | POA: Diagnosis not present

## 2021-06-21 DIAGNOSIS — L570 Actinic keratosis: Secondary | ICD-10-CM | POA: Diagnosis not present

## 2021-06-21 DIAGNOSIS — D225 Melanocytic nevi of trunk: Secondary | ICD-10-CM | POA: Diagnosis not present

## 2021-09-03 DIAGNOSIS — H6121 Impacted cerumen, right ear: Secondary | ICD-10-CM | POA: Diagnosis not present

## 2021-09-03 DIAGNOSIS — E785 Hyperlipidemia, unspecified: Secondary | ICD-10-CM | POA: Diagnosis not present

## 2021-09-03 DIAGNOSIS — F17200 Nicotine dependence, unspecified, uncomplicated: Secondary | ICD-10-CM | POA: Diagnosis not present

## 2021-09-03 DIAGNOSIS — T50B95A Adverse effect of other viral vaccines, initial encounter: Secondary | ICD-10-CM | POA: Diagnosis not present

## 2021-09-03 DIAGNOSIS — I700 Atherosclerosis of aorta: Secondary | ICD-10-CM | POA: Diagnosis not present

## 2021-09-03 DIAGNOSIS — E1169 Type 2 diabetes mellitus with other specified complication: Secondary | ICD-10-CM | POA: Diagnosis not present

## 2021-09-03 DIAGNOSIS — Z789 Other specified health status: Secondary | ICD-10-CM | POA: Diagnosis not present

## 2021-09-03 DIAGNOSIS — I2510 Atherosclerotic heart disease of native coronary artery without angina pectoris: Secondary | ICD-10-CM | POA: Diagnosis not present

## 2021-09-03 DIAGNOSIS — I10 Essential (primary) hypertension: Secondary | ICD-10-CM | POA: Diagnosis not present

## 2021-09-06 DIAGNOSIS — H2513 Age-related nuclear cataract, bilateral: Secondary | ICD-10-CM | POA: Diagnosis not present

## 2021-09-14 ENCOUNTER — Encounter: Payer: Self-pay | Admitting: Internal Medicine

## 2021-09-14 ENCOUNTER — Other Ambulatory Visit: Payer: Self-pay

## 2021-09-14 ENCOUNTER — Ambulatory Visit: Payer: Medicare Other | Admitting: Internal Medicine

## 2021-09-14 VITALS — BP 128/82 | HR 72 | Ht 63.0 in | Wt 170.4 lb

## 2021-09-14 DIAGNOSIS — I2510 Atherosclerotic heart disease of native coronary artery without angina pectoris: Secondary | ICD-10-CM

## 2021-09-14 NOTE — Progress Notes (Signed)
Cardiology Office Note   Date:  09/14/2021   ID:  Rebecca Dalton, DOB August 09, 1961, MRN [id number]  PCP:  Rebecca Stains, MD  Cardiologist:   Rebecca Carnes, MD   Patient presents for follow-up with CAD    History of Present Illness: Rebecca Dalton is a 61 y.o. female with a history ofCAD s/p DES to LAD, HTN, HLD    She has had high risk stress test 09/2016. Follow up cath showed 99% LAD lesion s/p PTCA and DES. Normal LV function. Brillinta switched to Effient due to breathing issue. Later changed to Plavix with plan to keep long term along with aspirin.    I saw the pt in Jan 2022    Since seen she denies CP   Breathing is OK   No dizziness     Current Meds  Medication Sig   amLODipine (NORVASC) 2.5 MG tablet Take 2.5 mg by mouth daily.   aspirin EC 81 MG tablet Take 1 tablet (81 mg total) by mouth daily.   atorvastatin (LIPITOR) 80 MG tablet TAKE 1 TABLET BY MOUTH DAILY AT 6 PM.   BIOTIN PO Take 1 tablet by mouth daily.   carvedilol (COREG) 25 MG tablet Take 25 mg by mouth 2 (two) times daily with a meal.   Cholecalciferol (VITAMIN D PO) Take 3 capsules by mouth once a week.   clopidogrel (PLAVIX) 75 MG tablet TAKE 1 TABLET BY MOUTH EVERY DAY   cyclobenzaprine (FLEXERIL) 10 MG tablet Take 5-10 mg by mouth daily as needed for muscle spasms   hydrochlorothiazide (MICROZIDE) 12.5 MG capsule Take 12.5 mg by mouth daily.   ketotifen (ZADITOR) 0.025 % ophthalmic solution Place 1 drop into both eyes as needed (for allergies).   LORazepam (ATIVAN) 0.5 MG tablet Take 0.5 mg by mouth daily as needed.    MAGNESIUM PO Take 1 tablet by mouth 4 (four) times a week.   nitroGLYCERIN (NITROSTAT) 0.4 MG SL tablet Place 1 tablet (0.4 mg total) under the tongue every 5 (five) minutes as needed for chest pain.   olmesartan (BENICAR) 40 MG tablet Take 40 mg by mouth daily.   polyethylene glycol (MIRALAX / GLYCOLAX) packet Take 17 g by mouth daily.    Probiotic Product (PROBIOTIC PO) Take 1 capsule by  mouth daily.     Allergies:   Penicillin g, Penicillins, Betamethasone, Codeine, Sulfa antibiotics, Sulfamethoxazole, Sulfasalazine, and Latex   Past Medical History:  Diagnosis Date   Abnormal cardiovascular stress test 10/07/2016   Anxiety    CAD in native artery 10/07/2016   Chronic bronchitis (HCC)    Coronary artery disease    GERD (gastroesophageal reflux disease)    HLD (hyperlipidemia) 10/07/2016   HTN (hypertension) 10/07/2016   Hypertension    Pneumonia    "twice" (10/06/2016)   S/P angioplasty with stent, 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/06/16 10/07/2016    Past Surgical History:  Procedure Laterality Date   ANTERIOR CERVICAL DECOMP/DISCECTOMY FUSION  ~ 2005   C6-7   BACK SURGERY     CORONARY ANGIOPLASTY WITH STENT PLACEMENT  10/06/2016   CORONARY STENT INTERVENTION N/A 10/06/2016   Procedure: Coronary Stent Intervention;  Surgeon: Rebecca Blanks, MD;  Location: [facility];  Service: Cardiovascular;  Laterality: N/A;   ELBOW ARTHROSCOPY Right 05/03/2016   Procedure: RIGHT ARTHROSCOPY ELBOW WITH REMOVAL FOREIGN BODIES;  Surgeon: Rebecca Cover, MD;  Location: [facility];  Service: Orthopedics;  Laterality: Right;   LEFT HEART CATH AND CORONARY  ANGIOGRAPHY N/A 10/06/2016   Procedure: Left Heart Cath and Coronary Angiography;  Surgeon: Rebecca Blanks, MD;  Location: [facility];  Service: Cardiovascular;  Laterality: N/A;   REFRACTIVE SURGERY Bilateral    TONSILLECTOMY       Social History:  The patient  reports that she has been smoking cigarettes. She has a 19.50 pack-year smoking history. She has never used smokeless tobacco. She reports current alcohol use of about 21.0 standard drinks per week. She reports that she does not use drugs.   Family History:  The patient's family history includes Diabetes in her sister; Heart attack (age of onset: 51) in her sister; Heart attack (age of onset: 58) in her maternal grandmother; Heart attack (age of onset:  60) in her brother; Heart attack (age of onset: 13) in her father; Heart disease in her father.    ROS:  Please see the history of present illness. All other systems are reviewed and  Negative to the above problem except as noted.    PHYSICAL EXAM: VS:  BP 128/82    Pulse 72    Ht 5\' 3"  (1.6 m)    Wt 170 lb 6.4 oz (77.3 kg)    SpO2 98%    BMI 30.19 kg/m   GEN: Obese 61 yo in no acute distress  HEENT: normal  Neck: no JVD, No carotid bruits Cardiac: RRR; no murmurs.  No  LE edema  Respiratory:  clear to auscultation bilaterally, GI: soft, nontender, nondistended, + BS  No hepatomegaly  MS: no deformity Moving all extremities   Skin: warm and dry, no rash Neuro:  Strength and sensation are intact Psych: euthymic mood, full affect   EKG:  EKG is ordered today.  NSR 72 bpm      Lipid Panel    Component Value Date/Time   CHOL 127 11/07/2019 1105   TRIG 155 (H) 11/07/2019 1105   HDL 41 11/07/2019 1105   CHOLHDL 3.1 11/07/2019 1105   LDLCALC 59 11/07/2019 1105      Wt Readings from Last 3 Encounters:  09/14/21 170 lb 6.4 oz (77.3 kg)  09/11/20 174 lb 9.6 oz (79.2 kg)  11/07/19 174 lb (78.9 kg)      ASSESSMENT AND PLAN:  1 CAD.  S/p  PTCA/stent to LAD in 2018  REmains on ASA and plavix given location of lesion.   Remains symptom  free   Follow CBC  2 dyslipidemia   LDL 44  HDL 44  Trig 188  Discussed diet   She has significant GI issues which makes things difficult      3.  Hypertension. BP is adequately controlled    4 GI  Pt followed by Dr Rebecca Dalton in GI   Problems began about 3 years ago    has significant diarrhea daily   F/U appt soon    F/U next winter    Current medicines are reviewed at length with the patient today.  The patient does not have concerns regarding medicines.  Signed, Rebecca Carnes, MD  09/14/2021 11:21 AM    [address], [place], [place]  [address] Phone: [phone]; Fax: [phone]

## 2021-09-14 NOTE — Patient Instructions (Signed)
Medication Instructions:   *If you need a refill on your cardiac medications before your next appointment, please call your pharmacy*   Lab Work:  If you have labs (blood work) drawn today and your tests are completely normal, you will receive your results only by: [place] (if you have MyChart) OR A paper copy in the mail If you have any lab test that is abnormal or we need to change your treatment, we will call you to review the results.   Testing/Procedures:    Follow-Up: At [facility], you and your health needs are our priority.  As part of our continuing mission to provide you with exceptional heart care, we have created designated Provider Care Teams.  These Care Teams include your primary Cardiologist (physician) and Advanced Practice Providers (APPs -  Physician Assistants and Nurse Practitioners) who all work together to provide you with the care you need, when you need it.  We recommend signing up for the patient portal called "MyChart".  Sign up information is provided on this After Visit Summary.  MyChart is used to connect with patients for Virtual Visits (Telemedicine).  Patients are able to view lab/test results, encounter notes, upcoming appointments, etc.  Non-urgent messages can be sent to your provider as well.   To learn more about what you can do with MyChart, go to [email or web address].    Your next appointment:   1 year(s)  The format for your next appointment:   In Person  Provider:   Dorris Carnes, MD     Other Instructions

## 2021-09-30 ENCOUNTER — Other Ambulatory Visit: Payer: Self-pay

## 2021-09-30 MED ORDER — CLOPIDOGREL BISULFATE 75 MG PO TABS
75.0000 mg | ORAL_TABLET | Freq: Every day | ORAL | 3 refills | Status: DC
Start: 2021-09-30 — End: 2022-11-02

## 2021-12-14 DIAGNOSIS — R1901 Right upper quadrant abdominal swelling, mass and lump: Secondary | ICD-10-CM | POA: Diagnosis not present

## 2021-12-15 ENCOUNTER — Other Ambulatory Visit: Payer: Self-pay | Admitting: Obstetrics and Gynecology

## 2021-12-15 DIAGNOSIS — R1900 Intra-abdominal and pelvic swelling, mass and lump, unspecified site: Secondary | ICD-10-CM

## 2022-01-10 ENCOUNTER — Ambulatory Visit
Admission: RE | Admit: 2022-01-10 | Discharge: 2022-01-10 | Disposition: A | Discharge status: Home / Self Care | Payer: Medicare Other | Source: Ambulatory Visit | Attending: Obstetrics and Gynecology | Admitting: Obstetrics and Gynecology

## 2022-01-10 DIAGNOSIS — R1900 Intra-abdominal and pelvic swelling, mass and lump, unspecified site: Secondary | ICD-10-CM

## 2022-01-10 DIAGNOSIS — K760 Fatty (change of) liver, not elsewhere classified: Secondary | ICD-10-CM | POA: Diagnosis not present

## 2022-01-10 DIAGNOSIS — M47816 Spondylosis without myelopathy or radiculopathy, lumbar region: Secondary | ICD-10-CM | POA: Diagnosis not present

## 2022-01-10 DIAGNOSIS — K5730 Diverticulosis of large intestine without perforation or abscess without bleeding: Secondary | ICD-10-CM | POA: Diagnosis not present

## 2022-01-10 DIAGNOSIS — N281 Cyst of kidney, acquired: Secondary | ICD-10-CM | POA: Diagnosis not present

## 2022-01-10 DIAGNOSIS — I2510 Atherosclerotic heart disease of native coronary artery without angina pectoris: Secondary | ICD-10-CM | POA: Diagnosis not present

## 2022-01-10 DIAGNOSIS — I700 Atherosclerosis of aorta: Secondary | ICD-10-CM | POA: Diagnosis not present

## 2022-01-10 MED ORDER — IOPAMIDOL (ISOVUE-300) INJECTION 61%
100.0000 mL | Freq: Once | INTRAVENOUS | Status: AC | PRN
  Administered 2022-01-10: 100 mL via INTRAVENOUS

## 2022-03-02 DIAGNOSIS — M545 Low back pain, unspecified: Secondary | ICD-10-CM | POA: Diagnosis not present

## 2022-03-02 DIAGNOSIS — Z0000 Encounter for general adult medical examination without abnormal findings: Secondary | ICD-10-CM | POA: Diagnosis not present

## 2022-03-02 DIAGNOSIS — M791 Myalgia, unspecified site: Secondary | ICD-10-CM | POA: Diagnosis not present

## 2022-03-02 DIAGNOSIS — H6991 Unspecified Eustachian tube disorder, right ear: Secondary | ICD-10-CM | POA: Diagnosis not present

## 2022-03-02 DIAGNOSIS — R197 Diarrhea, unspecified: Secondary | ICD-10-CM | POA: Diagnosis not present

## 2022-03-02 DIAGNOSIS — E1169 Type 2 diabetes mellitus with other specified complication: Secondary | ICD-10-CM | POA: Diagnosis not present

## 2022-03-02 DIAGNOSIS — I700 Atherosclerosis of aorta: Secondary | ICD-10-CM | POA: Diagnosis not present

## 2022-03-02 DIAGNOSIS — F17200 Nicotine dependence, unspecified, uncomplicated: Secondary | ICD-10-CM | POA: Diagnosis not present

## 2022-03-02 DIAGNOSIS — Z23 Encounter for immunization: Secondary | ICD-10-CM | POA: Diagnosis not present

## 2022-03-02 DIAGNOSIS — E785 Hyperlipidemia, unspecified: Secondary | ICD-10-CM | POA: Diagnosis not present

## 2022-03-02 DIAGNOSIS — I10 Essential (primary) hypertension: Secondary | ICD-10-CM | POA: Diagnosis not present

## 2022-03-02 DIAGNOSIS — E559 Vitamin D deficiency, unspecified: Secondary | ICD-10-CM | POA: Diagnosis not present

## 2022-03-02 DIAGNOSIS — I2510 Atherosclerotic heart disease of native coronary artery without angina pectoris: Secondary | ICD-10-CM | POA: Diagnosis not present

## 2022-03-28 DIAGNOSIS — Z780 Asymptomatic menopausal state: Secondary | ICD-10-CM | POA: Diagnosis not present

## 2022-03-28 DIAGNOSIS — Z1231 Encounter for screening mammogram for malignant neoplasm of breast: Secondary | ICD-10-CM | POA: Diagnosis not present

## 2022-08-31 DIAGNOSIS — E559 Vitamin D deficiency, unspecified: Secondary | ICD-10-CM | POA: Diagnosis not present

## 2022-08-31 DIAGNOSIS — R197 Diarrhea, unspecified: Secondary | ICD-10-CM | POA: Diagnosis not present

## 2022-08-31 DIAGNOSIS — Z789 Other specified health status: Secondary | ICD-10-CM | POA: Diagnosis not present

## 2022-08-31 DIAGNOSIS — I700 Atherosclerosis of aorta: Secondary | ICD-10-CM | POA: Diagnosis not present

## 2022-08-31 DIAGNOSIS — H938X1 Other specified disorders of right ear: Secondary | ICD-10-CM | POA: Diagnosis not present

## 2022-08-31 DIAGNOSIS — E785 Hyperlipidemia, unspecified: Secondary | ICD-10-CM | POA: Diagnosis not present

## 2022-08-31 DIAGNOSIS — M545 Low back pain, unspecified: Secondary | ICD-10-CM | POA: Diagnosis not present

## 2022-08-31 DIAGNOSIS — I10 Essential (primary) hypertension: Secondary | ICD-10-CM | POA: Diagnosis not present

## 2022-08-31 DIAGNOSIS — I2510 Atherosclerotic heart disease of native coronary artery without angina pectoris: Secondary | ICD-10-CM | POA: Diagnosis not present

## 2022-08-31 DIAGNOSIS — E1169 Type 2 diabetes mellitus with other specified complication: Secondary | ICD-10-CM | POA: Diagnosis not present

## 2022-08-31 DIAGNOSIS — F17200 Nicotine dependence, unspecified, uncomplicated: Secondary | ICD-10-CM | POA: Diagnosis not present

## 2022-10-27 IMAGING — CT CT ABD-PELV W/ CM
1 of 3 series · 13 of 32 positions shown, 18 images · IV contrast (APPLIED)
Comparison: None.

CLINICAL DATA: Elevated liver enzymes.

EXAM:
CT ABDOMEN AND PELVIS WITH CONTRAST
TECHNIQUE: Multidetector CT imaging of the abdomen and pelvis was performed
using the standard protocol following bolus administration of
intravenous contrast.
CONTRAST:  100mL JINVO0-WZZ IOPAMIDOL (JINVO0-WZZ) INJECTION 61%

[Series 2: abd/pelvis w/cm · axial · 0.82mm/px · z∈[-441,-81]mm · 13 of 84 slices shown, 18 images]
[im 6/84  soft-tissue]
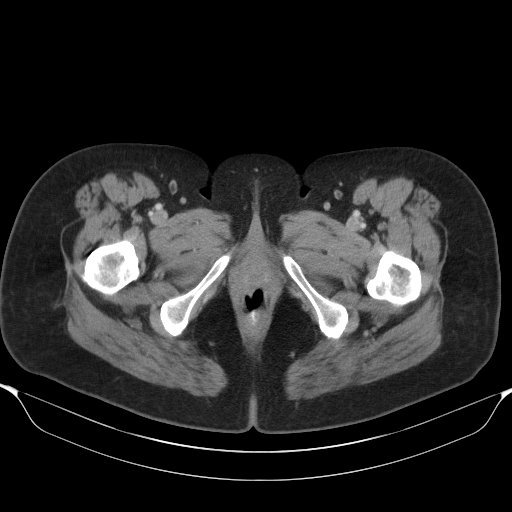
[im 6/84  bone]
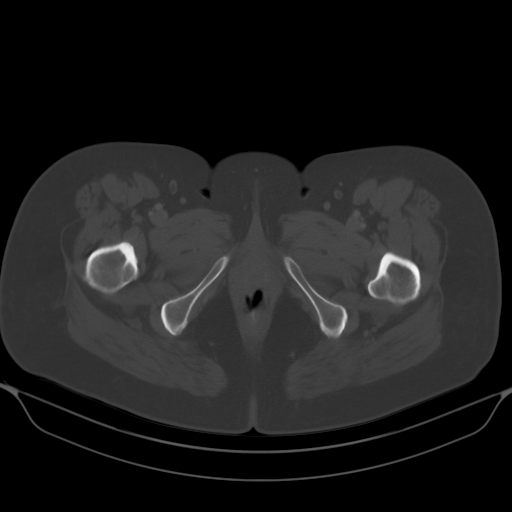
[im 11/84  soft-tissue]
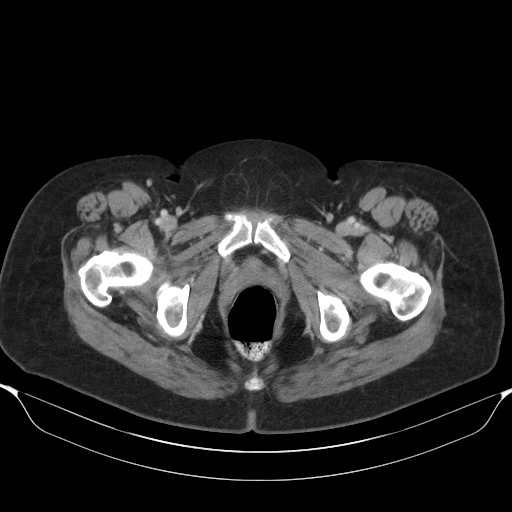
[im 21/84  soft-tissue]
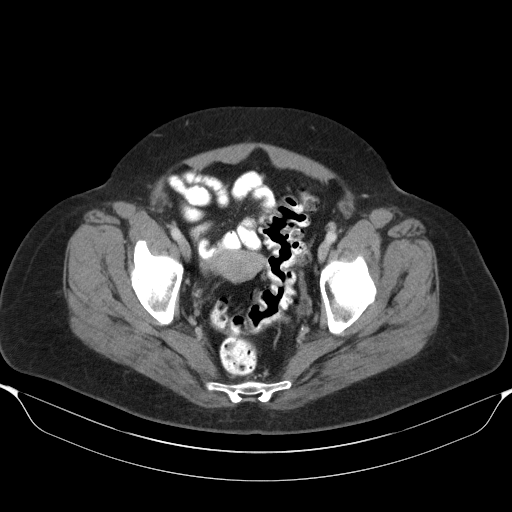
[im 26/84  soft-tissue]
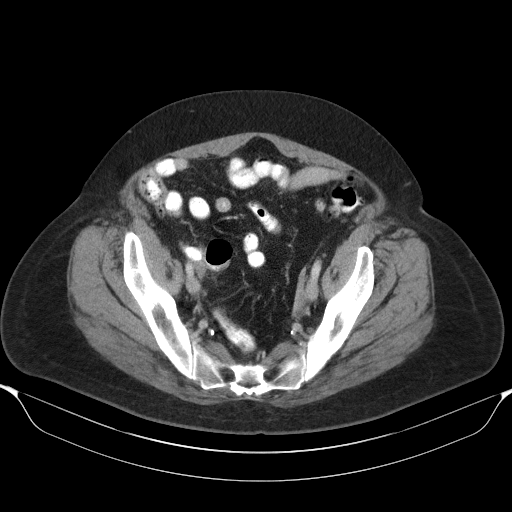
[im 32/84  soft-tissue]
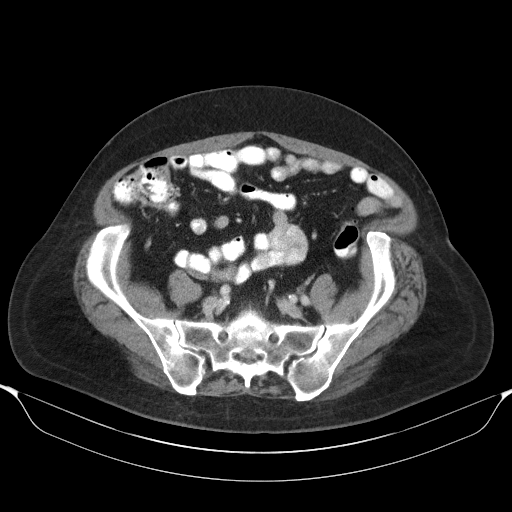
[im 37/84  soft-tissue]
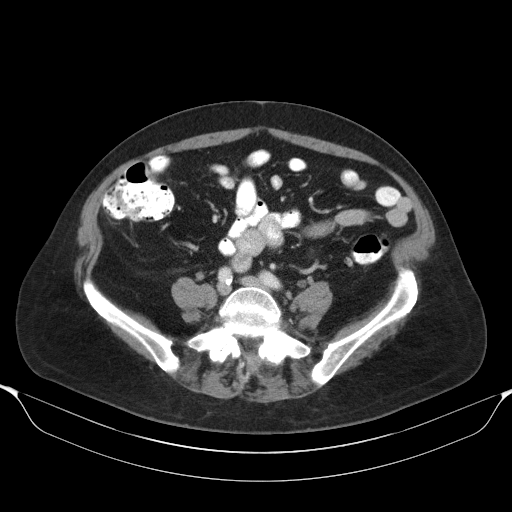
[im 47/84  soft-tissue]
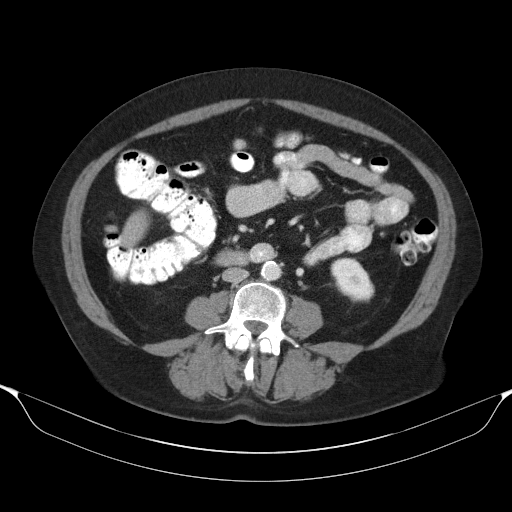
[im 52/84  soft-tissue]
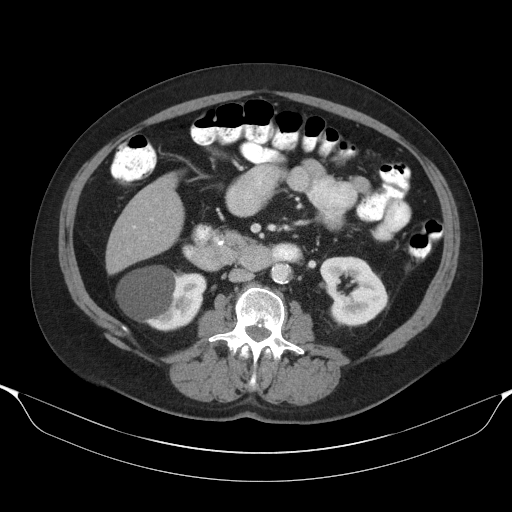
[im 58/84  soft-tissue]
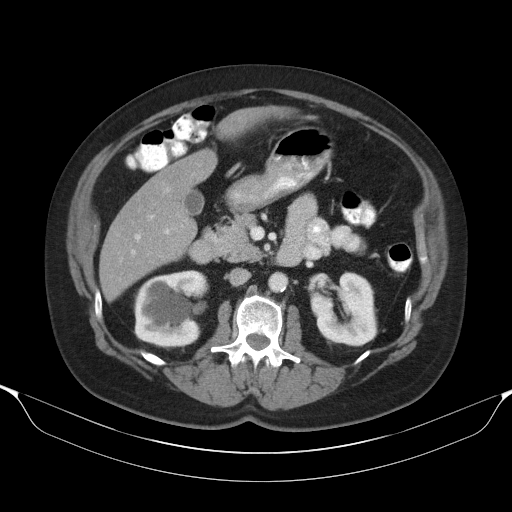
[im 58/84  bone]
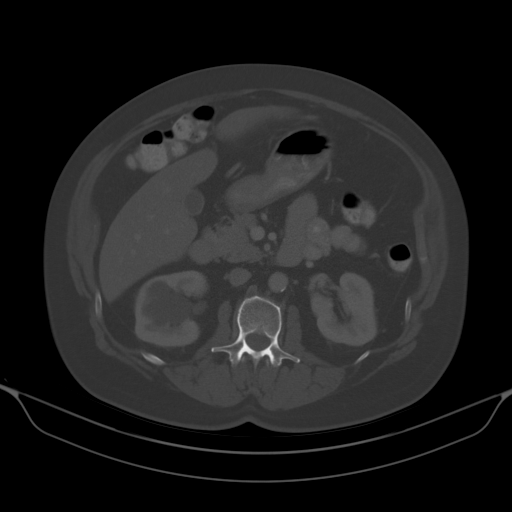
[im 63/84  soft-tissue]
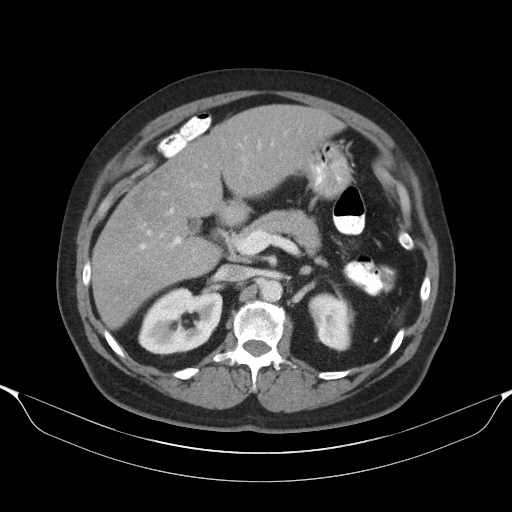
[im 63/84  lung]
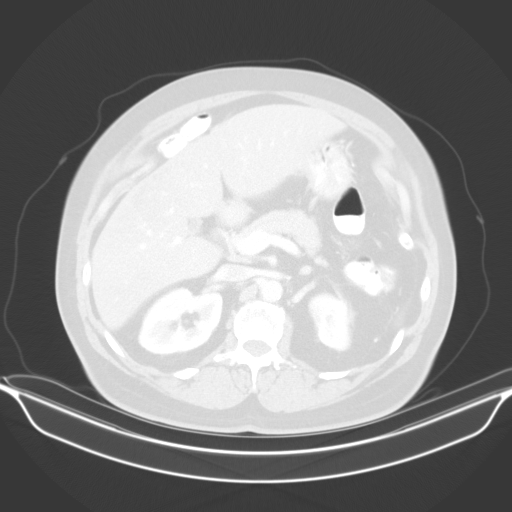
[im 68/84  lung]
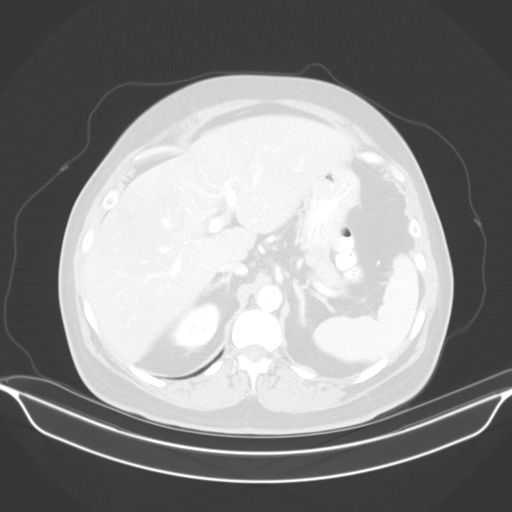
[im 73/84  soft-tissue]
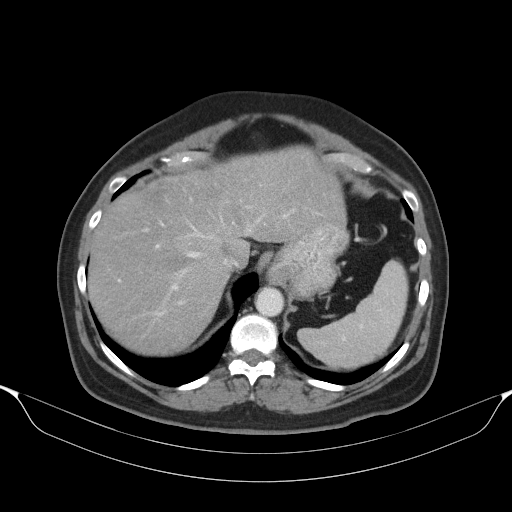
[im 73/84  lung]
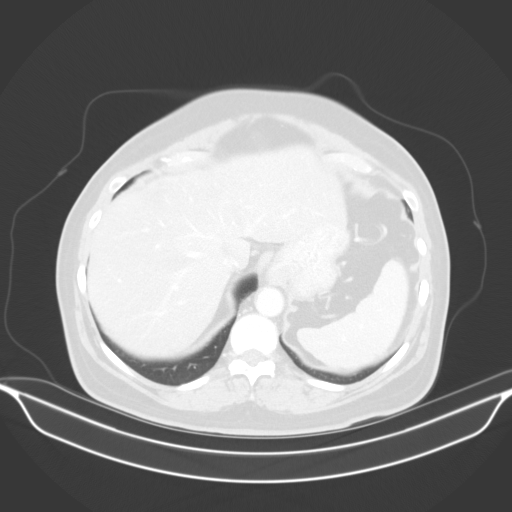
[im 78/84  soft-tissue]
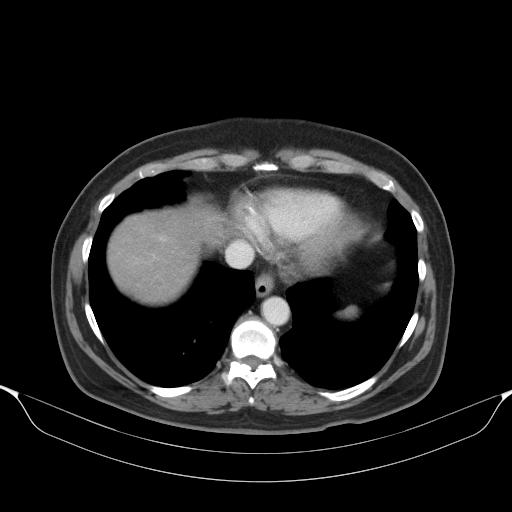
[im 78/84  lung]
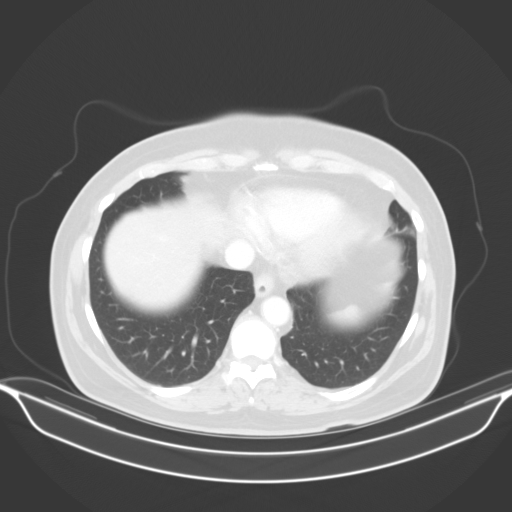

[13 of 32 positions shown; findings below may reference images not displayed]

FINDINGS: Lower chest: No acute abnormality.

Hepatobiliary: Liver is diffusely low in density indicating fatty
infiltration. Additional ill-defined low-density area within the
RIGHT liver lobe, without discrete masslike configuration.

Gallbladder is decompressed but otherwise unremarkable. No bile duct
dilatation is seen.

Pancreas: Unremarkable. No pancreatic ductal dilatation or
surrounding inflammatory changes.

Spleen: Normal in size without focal abnormality.

Adrenals/Urinary Tract: Adrenal glands appear normal. RIGHT renal
cyst measures 6 cm. Kidneys otherwise unremarkable without
suspicious mass, stone or hydronephrosis. No ureteral or bladder
calculi are identified. Bladder is unremarkable, partially
decompressed.

Stomach/Bowel: No dilated large or small bowel loops. Diverticulosis
of the descending and sigmoid colon but no focal inflammatory change
to suggest acute diverticulitis. No evidence of bowel wall
inflammation. Stomach is unremarkable, partially decompressed.
Appendix is normal.

Vascular/Lymphatic: Aortic atherosclerosis. No acute appearing
vascular abnormality. No enlarged lymph nodes are seen.

Reproductive: Uterus and bilateral adnexa are unremarkable.

Other: No free fluid or abscess collection. No free intraperitoneal
air.

Musculoskeletal: Degenerative spondylosis of the slightly scoliotic
thoracolumbar spine, mild to moderate in degree. No acute or
suspicious osseous abnormality.
IMPRESSION: 1. Fatty infiltration of the liver.
2. Additional ill-defined low-density area within the RIGHT liver
lobe, but without discrete masslike configuration, measuring
approximately 7 cm greatest dimension (series 2, image 17). This
could represent neoplastic process or asymmetrically prominent fatty
infiltration. Recommend further characterization with nonemergent
liver MRI.
3. Colonic diverticulosis without evidence of acute diverticulitis.

Aortic Atherosclerosis (8Q1HJ-A46.6).

## 2022-11-02 ENCOUNTER — Other Ambulatory Visit: Payer: Self-pay | Admitting: Internal Medicine

## 2022-11-09 DIAGNOSIS — D225 Melanocytic nevi of trunk: Secondary | ICD-10-CM | POA: Diagnosis not present

## 2022-11-09 DIAGNOSIS — L718 Other rosacea: Secondary | ICD-10-CM | POA: Diagnosis not present

## 2022-11-09 DIAGNOSIS — L821 Other seborrheic keratosis: Secondary | ICD-10-CM | POA: Diagnosis not present

## 2022-11-09 DIAGNOSIS — L814 Other melanin hyperpigmentation: Secondary | ICD-10-CM | POA: Diagnosis not present

## 2022-11-13 NOTE — Progress Notes (Unsigned)
Cardiology Office Note   Date:  11/14/2022   ID:  Rebecca Dalton, DOB [birth year], MRN [id number]  PCP:  Harlan Stains, MD  Cardiologist:   Dorris Carnes, MD   Patient presents for follow-up with CAD    History of Present Illness: Rebecca Dalton is a 62 y.o. female with a history ofCAD s/p DES to LAD, HTN, HLD    She has had high risk stress test 09/2016. Follow up cath showed 99% LAD lesion s/p PTCA and DES. Normal LV function. Brillinta switched to Effient due to breathing issue. Later changed to Plavix with plan to keep long term along with aspirin.    I saw the pt in Jan 2023 Patient says she is walking about 1.5 miles 4x pwer week  [facility] says she is feeling OK  EXCEPT when she walks up a hill   In last month with hills, she  will have to stop  Can't get a breath  She could walk up hilss 3 months ago    She also has intermitt chest heaviness  Says she feelis like She wants to cough   No PND   No syncope   No palpitations   No LE edema  Note:   In Jan 2024 she  stopped taking HCTZ   She says symptoms started 3 wks after      Current Meds  Medication Sig   amLODipine (NORVASC) 2.5 MG tablet Take 2.5 mg by mouth daily.   aspirin EC 81 MG tablet Take 1 tablet (81 mg total) by mouth daily.   atorvastatin (LIPITOR) 80 MG tablet TAKE 1 TABLET BY MOUTH DAILY AT 6 PM.   BIOTIN PO Take 1 tablet by mouth daily.   carvedilol (COREG) 25 MG tablet Take 25 mg by mouth 2 (two) times daily with a meal.   Cholecalciferol (VITAMIN D PO) Take 3 capsules by mouth once a week.   clopidogrel (PLAVIX) 75 MG tablet TAKE ONE TABLET BY MOUTH DAILY   cyclobenzaprine (FLEXERIL) 10 MG tablet Take 5-10 mg by mouth daily as needed for muscle spasms   ketotifen (ZADITOR) 0.025 % ophthalmic solution Place 1 drop into both eyes as needed (for allergies).   LORazepam (ATIVAN) 0.5 MG tablet Take 0.5 mg by mouth daily as needed.    MAGNESIUM PO Take 1 tablet by mouth 4 (four) times a week.   nitroGLYCERIN  (NITROSTAT) 0.4 MG SL tablet Place 1 tablet (0.4 mg total) under the tongue every 5 (five) minutes as needed for chest pain.   olmesartan (BENICAR) 40 MG tablet Take 40 mg by mouth daily.   polyethylene glycol (MIRALAX / GLYCOLAX) packet Take 17 g by mouth daily.    Probiotic Product (PROBIOTIC PO) Take 1 capsule by mouth daily.     Allergies:   Penicillin g, Penicillins, Betamethasone, Codeine, Sulfa antibiotics, Sulfamethoxazole, Sulfasalazine, and Latex   Past Medical History:  Diagnosis Date   Abnormal cardiovascular stress test 10/07/2016   Anxiety    CAD in native artery 10/07/2016   Chronic bronchitis ([place])    Coronary artery disease    GERD (gastroesophageal reflux disease)    HLD (hyperlipidemia) 10/07/2016   HTN (hypertension) 10/07/2016   Hypertension    Pneumonia    "twice" (10/06/2016)   S/P angioplasty with stent, 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/06/16 10/07/2016    Past Surgical History:  Procedure Laterality Date   ANTERIOR CERVICAL DECOMP/DISCECTOMY FUSION  ~ 2005   C6-7   BACK SURGERY  CORONARY ANGIOPLASTY WITH STENT PLACEMENT  10/06/2016   CORONARY STENT INTERVENTION N/A 10/06/2016   Procedure: Coronary Stent Intervention;  Surgeon: Burnell Blanks, MD;  Location: [facility];  Service: Cardiovascular;  Laterality: N/A;   ELBOW ARTHROSCOPY Right 05/03/2016   Procedure: RIGHT ARTHROSCOPY ELBOW WITH REMOVAL FOREIGN BODIES;  Surgeon: Leanora Cover, MD;  Location: [facility];  Service: Orthopedics;  Laterality: Right;   LEFT HEART CATH AND CORONARY ANGIOGRAPHY N/A 10/06/2016   Procedure: Left Heart Cath and Coronary Angiography;  Surgeon: Burnell Blanks, MD;  Location: [facility];  Service: Cardiovascular;  Laterality: N/A;   REFRACTIVE SURGERY Bilateral    TONSILLECTOMY       Social History:  The patient  reports that she has been smoking cigarettes. She has a 19.50 pack-year smoking history. She has never used smokeless tobacco. She reports  current alcohol use of about 21.0 standard drinks of alcohol per week. She reports that she does not use drugs.   Family History:  The patient's family history includes Diabetes in her sister; Heart attack (age of onset: 5) in her sister; Heart attack (age of onset: 45) in her maternal grandmother; Heart attack (age of onset: 32) in her brother; Heart attack (age of onset: 6) in her father; Heart disease in her father.    ROS:  Please see the history of present illness. All other systems are reviewed and  Negative to the above problem except as noted.    PHYSICAL EXAM: VS:  BP 132/84   Pulse 70   Ht 5' 3.5" (1.613 m)   Wt 170 lb 12.8 oz (77.5 kg)   SpO2 98%   BMI 29.78 kg/m   GEN: Obese 62 yo in no acute distress  HEENT: normal  Neck: no JVD, No carotid bruit Cardiac: RRR; no murmur.  No  LE edema  Respiratory:  clear to auscultation  Moving air   GI: soft, nontender No hepatomegaly  MS: no deformity Moving all extremities    EKG:  EKG is ordered today.  NSR 70 bpm      Lipid Panel    Component Value Date/Time   CHOL 127 11/07/2019 1105   TRIG 155 (H) 11/07/2019 1105   HDL 41 11/07/2019 1105   CHOLHDL 3.1 11/07/2019 1105   LDLCALC 59 11/07/2019 1105      Wt Readings from Last 3 Encounters:  11/14/22 170 lb 12.8 oz (77.5 kg)  09/14/21 170 lb 6.4 oz (77.3 kg)  09/11/20 174 lb 9.6 oz (79.2 kg)      ASSESSMENT AND PLAN:  1 CAD.  S/p  PTCA/stent to LAD in 2018  REmains on ASA and plavix given location of lesion. Concerning for DOE when walking hills   Volume status looks OK but curious that symptoms began after stopping HCTZ Recomm:  Take 25 mg HCTZ once   See if feels better    Will check CBC, BMET, BNP and TSH today    When review labs with patient, if she feels no improvement will set up for R/L heart cath  2 dyslipidemia  In Jan 2024 LDL 52 HDL 56 Trig 222/  Reviewed diet   Keep on lipitor    3.  Hypertension. BP is fair       Current medicines are reviewed  at length with the patient today.  The patient does not have concerns regarding medicines.  Signed, Dorris Carnes, MD  11/14/2022 9:31 PM    [facility] [place] [place], [place], [place]  [address] Phone: [phone]; Fax: [phone]

## 2022-11-14 ENCOUNTER — Encounter: Payer: Self-pay | Admitting: Internal Medicine

## 2022-11-14 ENCOUNTER — Ambulatory Visit: Payer: Medicare Other | Attending: Internal Medicine | Admitting: Internal Medicine

## 2022-11-14 VITALS — BP 132/84 | HR 70 | Ht 63.5 in | Wt 170.8 lb

## 2022-11-14 DIAGNOSIS — I10 Essential (primary) hypertension: Secondary | ICD-10-CM | POA: Diagnosis not present

## 2022-11-14 DIAGNOSIS — E782 Mixed hyperlipidemia: Secondary | ICD-10-CM

## 2022-11-14 DIAGNOSIS — Z720 Tobacco use: Secondary | ICD-10-CM | POA: Diagnosis not present

## 2022-11-14 DIAGNOSIS — G4733 Obstructive sleep apnea (adult) (pediatric): Secondary | ICD-10-CM

## 2022-11-14 DIAGNOSIS — I2510 Atherosclerotic heart disease of native coronary artery without angina pectoris: Secondary | ICD-10-CM | POA: Diagnosis not present

## 2022-11-14 NOTE — Patient Instructions (Signed)
Medication Instructions:  Take 2 of the HCTZ and let us know how you are feeling  *If you need a refill on your cardiac medications before your next appointment, please call your pharmacy*   Lab Work: CBC, BMET, PRO BNP, TSH  If you have labs (blood work) drawn today and your tests are completely normal, you will receive your results only by: [place] (if you have MyChart) OR A paper copy in the mail If you have any lab test that is abnormal or we need to change your treatment, we will call you to review the results.   Testing/Procedures:    Follow-Up: At [facility], you and your health needs are our priority.  As part of our continuing mission to provide you with exceptional heart care, we have created designated Provider Care Teams.  These Care Teams include your primary Cardiologist (physician) and Advanced Practice Providers (APPs -  Physician Assistants and Nurse Practitioners) who all work together to provide you with the care you need, when you need it.  We recommend signing up for the patient portal called "MyChart".  Sign up information is provided on this After Visit Summary.  MyChart is used to connect with patients for Virtual Visits (Telemedicine).  Patients are able to view lab/test results, encounter notes, upcoming appointments, etc.  Non-urgent messages can be sent to your provider as well.   To learn more about what you can do with MyChart, go to [email or web address].

## 2022-11-15 ENCOUNTER — Telehealth: Payer: Self-pay | Admitting: Internal Medicine

## 2022-11-15 LAB — CBC
Hematocrit: 39.9 % (ref 34.0–46.6)
Hemoglobin: 13.6 g/dL (ref 11.1–15.9)
MCH: 33.1 pg — ABNORMAL HIGH (ref 26.6–33.0)
MCHC: 34.1 g/dL (ref 31.5–35.7)
MCV: 97 fL (ref 79–97)
Platelets: 143 10*3/uL — ABNORMAL LOW (ref 150–450)
RBC: 4.11 x10E6/uL (ref 3.77–5.28)
RDW: 12.5 % (ref 11.7–15.4)
WBC: 5.5 10*3/uL (ref 3.4–10.8)

## 2022-11-15 LAB — TSH: TSH: 3.11 u[IU]/mL (ref 0.450–4.500)

## 2022-11-15 LAB — BASIC METABOLIC PANEL
BUN/Creatinine Ratio: 18 (ref 12–28)
BUN: 15 mg/dL (ref 8–27)
CO2: 24 mmol/L (ref 20–29)
Calcium: 9.9 mg/dL (ref 8.7–10.3)
Chloride: 101 mmol/L (ref 96–106)
Creatinine, Ser: 0.82 mg/dL (ref 0.57–1.00)
Glucose: 104 mg/dL — ABNORMAL HIGH (ref 70–99)
Potassium: 4.6 mmol/L (ref 3.5–5.2)
Sodium: 142 mmol/L (ref 134–144)
eGFR: 80 mL/min/{1.73_m2} (ref >59)

## 2022-11-15 LAB — PRO B NATRIURETIC PEPTIDE: NT-Pro BNP: <36 pg/mL (ref 0–287)

## 2022-11-15 NOTE — Telephone Encounter (Signed)
Spoke with pt and advised of lab results per Dr Harrington Challenger as below.  Pt states she does feel better after taking the HCTZ 25mg .  She states she walked this morning and did experience some mild chest tightness but overall she has had improvement.  Pt advised will forward to Dr Harrington Challenger for review and any further recommendations.  Pt verbalizes understanding and thanked Therapist, sports for the call.  Fay Records, MD 11/15/2022 11:06 AM EDT Back to Top    Left voice mail Labs look good  CBC OK   Thyroid normal Electrolytes and kidney function OK   Fluid appears to be OK I have asked pt to call back with how feeling after taking 25 mg HCTZ

## 2022-11-15 NOTE — Telephone Encounter (Signed)
Follow Up:      Patient is returning  Dr Harrington Challenger call from today. She wants to talk to dr Harrington Challenger.

## 2022-12-13 DIAGNOSIS — H53143 Visual discomfort, bilateral: Secondary | ICD-10-CM | POA: Diagnosis not present

## 2022-12-13 DIAGNOSIS — H524 Presbyopia: Secondary | ICD-10-CM | POA: Diagnosis not present

## 2022-12-28 ENCOUNTER — Telehealth: Payer: Medicare Other | Admitting: Nurse Practitioner

## 2022-12-28 DIAGNOSIS — J40 Bronchitis, not specified as acute or chronic: Secondary | ICD-10-CM

## 2022-12-28 MED ORDER — AZITHROMYCIN 250 MG PO TABS
ORAL_TABLET | ORAL | 0 refills | Status: AC
Start: 2022-12-28 — End: 2023-01-02

## 2022-12-28 NOTE — Progress Notes (Signed)
Virtual Visit Consent   [name], you are scheduled for a virtual visit with a [facility] provider today. Just as with appointments in the office, your consent must be obtained to participate. Your consent will be active for this visit and any virtual visit you may have with one of our providers in the next 365 days. If you have a MyChart account, a copy of this consent can be sent to you electronically.  As this is a virtual visit, video technology does not allow for your provider to perform a traditional examination. This may limit your provider's ability to fully assess your condition. If your provider identifies any concerns that need to be evaluated in person or the need to arrange testing (such as labs, EKG, etc.), we will make arrangements to do so. Although advances in technology are sophisticated, we cannot ensure that it will always work on either your end or our end. If the connection with a video visit is poor, the visit may have to be switched to a telephone visit. With either a video or telephone visit, we are not always able to ensure that we have a secure connection.  By engaging in this virtual visit, you consent to the provision of healthcare and authorize for your insurance to be billed (if applicable) for the services provided during this visit. Depending on your insurance coverage, you may receive a charge related to this service.  I need to obtain your verbal consent now. Are you willing to proceed with your visit today? [name] has provided verbal consent on 12/28/2022 for a virtual visit (video or telephone). Viviano Simas, FNP  Date: 12/28/2022 12:08 PM  Virtual Visit via Video Note   I, Viviano Simas, connected with  Rebecca Dalton  ([id number], 22-Sep-1960) on 12/28/22 at 12:15 PM EDT by a video-enabled telemedicine application and verified that I am speaking with the correct person using two identifiers.  Location: Patient: Virtual Visit Location Patient:  Home Provider: Virtual Visit Location Provider: Home Office   I discussed the limitations of evaluation and management by telemedicine and the availability of in person appointments. The patient expressed understanding and agreed to proceed.    History of Present Illness: Rebecca Dalton is a 62 y.o. who identifies as a female who was assigned female at birth, and is being seen today for sore throat and cough   She has been feeling sick  Sore throat started 4 days ago  Also has right ear pain today   Has a productive cough with thick green mucous   She has had a low grade fever   Denies a history of asthma or COPD  She was on vacation last week and into the weekend  Her partner has the similar symptoms without known diagnosis   Problems:  Patient Active Problem List   Diagnosis Date Noted   Tobacco abuse 06/06/2018   OSA (obstructive sleep apnea) 06/05/2018   CAD in native artery 10/07/2016   HLD (hyperlipidemia) 10/07/2016   S/P angioplasty with stent, 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/06/16 10/07/2016   Abnormal cardiovascular stress test 10/07/2016   HTN (hypertension) 10/07/2016   Unstable angina (HCC)     Allergies:  Allergies  Allergen Reactions   Penicillin G Shortness Of Breath    Tongue swelling Tongue swelling   Penicillins Shortness Of Breath   Betamethasone Other (See Comments)    Blood pressure and heart elevated   Codeine Nausea Only   Sulfa Antibiotics Hives  Sulfamethoxazole Other (See Comments)    Unknown   Sulfasalazine Hives   Latex Rash   Medications:  Current Outpatient Medications:    amLODipine (NORVASC) 2.5 MG tablet, Take 2.5 mg by mouth daily., Disp: , Rfl:    aspirin EC 81 MG tablet, Take 1 tablet (81 mg total) by mouth daily., Disp: 90 tablet, Rfl: 3   atorvastatin (LIPITOR) 80 MG tablet, TAKE 1 TABLET BY MOUTH DAILY AT 6 PM., Disp: 90 tablet, Rfl: 1   BIOTIN PO, Take 1 tablet by mouth daily., Disp: , Rfl:    carvedilol (COREG) 25 MG tablet, Take 25 mg  by mouth 2 (two) times daily with a meal., Disp: , Rfl:    Cholecalciferol (VITAMIN D PO), Take 3 capsules by mouth once a week., Disp: , Rfl:    clopidogrel (PLAVIX) 75 MG tablet, TAKE ONE TABLET BY MOUTH DAILY, Disp: 90 tablet, Rfl: 0   cyclobenzaprine (FLEXERIL) 10 MG tablet, Take 5-10 mg by mouth daily as needed for muscle spasms, Disp: , Rfl:    hydrochlorothiazide (MICROZIDE) 12.5 MG capsule, Take 12.5 mg by mouth daily., Disp: , Rfl:    ketotifen (ZADITOR) 0.025 % ophthalmic solution, Place 1 drop into both eyes as needed (for allergies)., Disp: , Rfl:    LORazepam (ATIVAN) 0.5 MG tablet, Take 0.5 mg by mouth daily as needed. , Disp: , Rfl:    MAGNESIUM PO, Take 1 tablet by mouth 4 (four) times a week., Disp: , Rfl:    nitroGLYCERIN (NITROSTAT) 0.4 MG SL tablet, Place 1 tablet (0.4 mg total) under the tongue every 5 (five) minutes as needed for chest pain., Disp: 25 tablet, Rfl: 4   olmesartan (BENICAR) 40 MG tablet, Take 40 mg by mouth daily., Disp: , Rfl:    polyethylene glycol (MIRALAX / GLYCOLAX) packet, Take 17 g by mouth daily. , Disp: , Rfl:    Probiotic Product (PROBIOTIC PO), Take 1 capsule by mouth daily., Disp: , Rfl:   Observations/Objective: Patient is well-developed, well-nourished in no acute distress.  Resting comfortably  at home.  Head is normocephalic, atraumatic.  No labored breathing.  Speech is clear and coherent with logical content.  Patient is alert and oriented at baseline.    Assessment and Plan: 1. Bronchitis Continue Mucinex increase water intake   - azithromycin (ZITHROMAX) 250 MG tablet; Take 2 tablets on day 1, then 1 tablet daily on days 2 through 5  Dispense: 6 tablet; Refill: 0     Follow Up Instructions: I discussed the assessment and treatment plan with the patient. The patient was provided an opportunity to ask questions and all were answered. The patient agreed with the plan and demonstrated an understanding of the instructions.  A copy of  instructions were sent to the patient via MyChart unless otherwise noted below.    The patient was advised to call back or seek an in-person evaluation if the symptoms worsen or if the condition fails to improve as anticipated.  Time:  I spent 15 minutes with the patient via telehealth technology discussing the above problems/concerns.    Viviano Simas, FNP

## 2023-01-02 ENCOUNTER — Telehealth: Payer: Medicare Other | Admitting: Nurse Practitioner

## 2023-01-02 DIAGNOSIS — J0140 Acute pansinusitis, unspecified: Secondary | ICD-10-CM | POA: Diagnosis not present

## 2023-01-02 DIAGNOSIS — H65193 Other acute nonsuppurative otitis media, bilateral: Secondary | ICD-10-CM

## 2023-01-02 MED ORDER — DOXYCYCLINE HYCLATE 100 MG PO TABS: 100.0000 mg | ORAL_TABLET | Freq: Two times a day (BID) | ORAL | 0 refills | Status: AC

## 2023-01-02 NOTE — Progress Notes (Signed)
Virtual Visit Consent   Rebecca Dalton, you are scheduled for a virtual visit with a [facility] provider today. Just as with appointments in the office, your consent must be obtained to participate. Your consent will be active for this visit and any virtual visit you may have with one of our providers in the next 365 days. If you have a MyChart account, a copy of this consent can be sent to you electronically.  As this is a virtual visit, video technology does not allow for your provider to perform a traditional examination. This may limit your provider's ability to fully assess your condition. If your provider identifies any concerns that need to be evaluated in person or the need to arrange testing (such as labs, EKG, etc.), we will make arrangements to do so. Although advances in technology are sophisticated, we cannot ensure that it will always work on either your end or our end. If the connection with a video visit is poor, the visit may have to be switched to a telephone visit. With either a video or telephone visit, we are not always able to ensure that we have a secure connection.  By engaging in this virtual visit, you consent to the provision of healthcare and authorize for your insurance to be billed (if applicable) for the services provided during this visit. Depending on your insurance coverage, you may receive a charge related to this service.  I need to obtain your verbal consent now. Are you willing to proceed with your visit today? Rebecca Dalton has provided verbal consent on 01/02/2023 for a virtual visit (video or telephone). Viviano Simas, FNP  Date: 01/02/2023 2:10 PM  Virtual Visit via Video Note   I, Viviano Simas, connected with  Rebecca Dalton  ([id number], [birth year]) on 01/02/23 at  2:30 PM EDT by a video-enabled telemedicine application and verified that I am speaking with the correct person using two identifiers.  Location: Patient: Virtual Visit Location Patient:  Home Provider: Virtual Visit Location Provider: Home Office   I discussed the limitations of evaluation and management by telemedicine and the availability of in person appointments. The patient expressed understanding and agreed to proceed.    History of Present Illness: Rebecca Dalton is a 62 y.o. who identifies as a female who was assigned female at birth, and is being seen today for ongoing cough.  She was at the beach with symptom onset 1.5 weeks ago  Both herself and her partner became ill   She was started on Azithromycin for bronchitis and advised to continue Mucinex at the last visit.  Finished antibiotics yesterday  She does feel better  Less mucous production with cough  Feels the residual cough is from PND and in her throat but not deep in chest  Denies SOB  Denies wheezing  Denies tightness in chest  She does continue to have somewhat of a productive cough  She has more congestion in her ears, unable to hear from left ear diminished hearing from the right.   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/06/16 10/07/2016   Abnormal cardiovascular stress test 10/07/2016   HTN (hypertension) 10/07/2016   Unstable angina (HCC)     Allergies:  Allergies  Allergen Reactions   Penicillin G Shortness Of Breath    Tongue swelling Tongue swelling   Penicillins Shortness Of Breath   Betamethasone Other (See Comments)    Blood pressure and heart  elevated   Codeine Nausea Only   Sulfa Antibiotics Hives   Sulfamethoxazole Other (See Comments)    Unknown   Sulfasalazine Hives   Latex Rash   Medications:  Current Outpatient Medications:    amLODipine (NORVASC) 2.5 MG tablet, Take 2.5 mg by mouth daily., Disp: , Rfl:    aspirin EC 81 MG tablet, Take 1 tablet (81 mg total) by mouth daily., Disp: 90 tablet, Rfl: 3   atorvastatin (LIPITOR) 80 MG tablet, TAKE 1 TABLET BY MOUTH DAILY AT 6 PM., Disp: 90 tablet, Rfl: 1   azithromycin (ZITHROMAX) 250 MG tablet, Take 2 tablets on day 1, then 1 tablet daily on days 2 through 5, Disp: 6 tablet, Rfl: 0   BIOTIN PO, Take 1 tablet by mouth daily., Disp: , Rfl:    carvedilol (COREG) 25 MG tablet, Take 25 mg by mouth 2 (two) times daily with a meal., Disp: , Rfl:    Cholecalciferol (VITAMIN D PO), Take 3 capsules by mouth once a week., Disp: , Rfl:    clopidogrel (PLAVIX) 75 MG tablet, TAKE ONE TABLET BY MOUTH DAILY, Disp: 90 tablet, Rfl: 0   cyclobenzaprine (FLEXERIL) 10 MG tablet, Take 5-10 mg by mouth daily as needed for muscle spasms, Disp: , Rfl:    hydrochlorothiazide (MICROZIDE) 12.5 MG capsule, Take 12.5 mg by mouth daily., Disp: , Rfl:    ketotifen (ZADITOR) 0.025 % ophthalmic solution, Place 1 drop into both eyes as needed (for allergies)., Disp: , Rfl:    LORazepam (ATIVAN) 0.5 MG tablet, Take 0.5 mg by mouth daily as needed. , Disp: , Rfl:    MAGNESIUM PO, Take 1 tablet by mouth 4 (four) times a week., Disp: , Rfl:    nitroGLYCERIN (NITROSTAT) 0.4 MG SL tablet, Place 1 tablet (0.4 mg total) under the tongue every 5 (five) minutes as needed for chest pain., Disp: 25 tablet, Rfl: 4   olmesartan (BENICAR) 40 MG tablet, Take 40 mg by mouth daily., Disp: , Rfl:    polyethylene glycol (MIRALAX / GLYCOLAX) packet, Take 17 g by mouth daily. , Disp: , Rfl:    Probiotic Product (PROBIOTIC PO), Take 1 capsule by mouth daily., Disp: , Rfl:   Observations/Objective: Patient is well-developed,  well-nourished in no acute distress.  Resting comfortably  at home.  Head is normocephalic, atraumatic.  No labored breathing.  Speech is clear and coherent with logical content.  Patient is alert and oriented at baseline.    Assessment and Plan: 1. Other non-recurrent acute nonsuppurative otitis media of both ears  - doxycycline (VIBRA-TABS) 100 MG tablet; Take 1 tablet (100 mg total) by mouth 2 (two) times daily for 10 days.  Dispense: 20 tablet; Refill: 0  2. Acute non-recurrent pansinusitis  - doxycycline (VIBRA-TABS) 100 MG tablet; Take 1 tablet (100 mg total) by mouth 2 (two) times daily for 10 days.  Dispense: 20 tablet; Refill: 0    Continue plain Mucinex until productive cough  resolves  Flonase 2 sprays into each nostril daily for relief of congestion and pressure in ears  May continue ibuprofen as needed  Daily oral antihistamine OTC as well May use warm compress to ears    Avoid decongestants due to cardiac history   Follow Up Instructions: I discussed the assessment and treatment plan with the patient. The patient was provided an opportunity to ask questions and all were answered. The patient agreed with the plan and demonstrated an understanding of the instructions.  A copy of instructions were sent to the patient via MyChart unless otherwise noted below.    The patient was advised to call back or seek an in-person evaluation if the symptoms worsen or if the condition fails to improve as anticipated.  Time:  I spent 15 minutes with the patient via telehealth technology discussing the above problems/concerns.    Viviano Simas, FNP

## 2023-01-25 DIAGNOSIS — H6993 Unspecified Eustachian tube disorder, bilateral: Secondary | ICD-10-CM | POA: Diagnosis not present

## 2023-01-30 ENCOUNTER — Other Ambulatory Visit: Payer: Self-pay | Admitting: Internal Medicine

## 2023-02-16 DIAGNOSIS — J019 Acute sinusitis, unspecified: Secondary | ICD-10-CM | POA: Diagnosis not present

## 2023-03-23 DIAGNOSIS — E559 Vitamin D deficiency, unspecified: Secondary | ICD-10-CM | POA: Diagnosis not present

## 2023-03-23 DIAGNOSIS — I2510 Atherosclerotic heart disease of native coronary artery without angina pectoris: Secondary | ICD-10-CM | POA: Diagnosis not present

## 2023-03-23 DIAGNOSIS — I10 Essential (primary) hypertension: Secondary | ICD-10-CM | POA: Diagnosis not present

## 2023-03-23 DIAGNOSIS — Z0000 Encounter for general adult medical examination without abnormal findings: Secondary | ICD-10-CM | POA: Diagnosis not present

## 2023-03-23 DIAGNOSIS — E119 Type 2 diabetes mellitus without complications: Secondary | ICD-10-CM | POA: Diagnosis not present

## 2023-03-23 DIAGNOSIS — Z789 Other specified health status: Secondary | ICD-10-CM | POA: Diagnosis not present

## 2023-03-23 DIAGNOSIS — E1169 Type 2 diabetes mellitus with other specified complication: Secondary | ICD-10-CM | POA: Diagnosis not present

## 2023-03-23 DIAGNOSIS — D696 Thrombocytopenia, unspecified: Secondary | ICD-10-CM | POA: Diagnosis not present

## 2023-03-23 DIAGNOSIS — M791 Myalgia, unspecified site: Secondary | ICD-10-CM | POA: Diagnosis not present

## 2023-03-23 DIAGNOSIS — E785 Hyperlipidemia, unspecified: Secondary | ICD-10-CM | POA: Diagnosis not present

## 2023-03-23 DIAGNOSIS — I700 Atherosclerosis of aorta: Secondary | ICD-10-CM | POA: Diagnosis not present

## 2023-03-23 DIAGNOSIS — F17210 Nicotine dependence, cigarettes, uncomplicated: Secondary | ICD-10-CM | POA: Diagnosis not present

## 2023-04-11 DIAGNOSIS — Z1231 Encounter for screening mammogram for malignant neoplasm of breast: Secondary | ICD-10-CM | POA: Diagnosis not present

## 2023-04-20 DIAGNOSIS — M19042 Primary osteoarthritis, left hand: Secondary | ICD-10-CM | POA: Diagnosis not present

## 2023-04-20 DIAGNOSIS — S6431XA Injury of digital nerve of right thumb, initial encounter: Secondary | ICD-10-CM | POA: Diagnosis not present

## 2023-05-23 DIAGNOSIS — M19042 Primary osteoarthritis, left hand: Secondary | ICD-10-CM | POA: Diagnosis not present

## 2023-06-13 DIAGNOSIS — M25551 Pain in right hip: Secondary | ICD-10-CM | POA: Diagnosis not present

## 2023-06-13 DIAGNOSIS — M545 Low back pain, unspecified: Secondary | ICD-10-CM | POA: Diagnosis not present

## 2023-06-13 DIAGNOSIS — M25552 Pain in left hip: Secondary | ICD-10-CM | POA: Diagnosis not present

## 2023-06-27 DIAGNOSIS — M79662 Pain in left lower leg: Secondary | ICD-10-CM | POA: Diagnosis not present

## 2023-06-28 DIAGNOSIS — E538 Deficiency of other specified B group vitamins: Secondary | ICD-10-CM | POA: Diagnosis not present

## 2023-06-29 DIAGNOSIS — S76312D Strain of muscle, fascia and tendon of the posterior muscle group at thigh level, left thigh, subsequent encounter: Secondary | ICD-10-CM | POA: Diagnosis not present

## 2023-07-03 DIAGNOSIS — S76312D Strain of muscle, fascia and tendon of the posterior muscle group at thigh level, left thigh, subsequent encounter: Secondary | ICD-10-CM | POA: Diagnosis not present

## 2023-07-05 DIAGNOSIS — S76312D Strain of muscle, fascia and tendon of the posterior muscle group at thigh level, left thigh, subsequent encounter: Secondary | ICD-10-CM | POA: Diagnosis not present

## 2023-07-11 DIAGNOSIS — S76312D Strain of muscle, fascia and tendon of the posterior muscle group at thigh level, left thigh, subsequent encounter: Secondary | ICD-10-CM | POA: Diagnosis not present

## 2023-07-12 ENCOUNTER — Telehealth: Payer: Self-pay

## 2023-07-12 ENCOUNTER — Other Ambulatory Visit: Payer: Self-pay | Admitting: Emergency Medicine

## 2023-07-12 DIAGNOSIS — Z87891 Personal history of nicotine dependence: Secondary | ICD-10-CM

## 2023-07-12 DIAGNOSIS — Z122 Encounter for screening for malignant neoplasm of respiratory organs: Secondary | ICD-10-CM

## 2023-07-12 NOTE — Telephone Encounter (Signed)
Returned call. LVM to call clinic back and discuss LCS program.

## 2023-07-13 DIAGNOSIS — S76312D Strain of muscle, fascia and tendon of the posterior muscle group at thigh level, left thigh, subsequent encounter: Secondary | ICD-10-CM | POA: Diagnosis not present

## 2023-07-14 ENCOUNTER — Ambulatory Visit: Payer: Medicare Other | Admitting: Physician Assistant

## 2023-07-14 ENCOUNTER — Encounter: Payer: Self-pay | Admitting: Physician Assistant

## 2023-07-14 DIAGNOSIS — Z122 Encounter for screening for malignant neoplasm of respiratory organs: Secondary | ICD-10-CM | POA: Diagnosis not present

## 2023-07-14 DIAGNOSIS — Z87891 Personal history of nicotine dependence: Secondary | ICD-10-CM | POA: Diagnosis not present

## 2023-07-14 NOTE — Progress Notes (Signed)
Virtual Visit via Telephone Note  I connected with Rebecca Dalton on 07/14/23 at 10:27 AM by telephone and verified that I am speaking with the correct person using two identifiers.  Location: Patient: home Provider: working virtually from home   I discussed the limitations, risks, security and privacy concerns of performing an evaluation and management service by telephone and the availability of in person appointments. I also discussed with the patient that there may be a patient responsible charge related to this service. The patient expressed understanding and agreed to proceed.       Shared Decision Making Visit Lung Cancer Screening Program [phone])   Eligibility: Age 62 Pack Years Smoking History Calculation 21 (# packs/per year x # years smoked) Recent History of coughing up blood  no Unexplained weight loss? No ( >Than 15 pounds within the last 6 months ) Prior History Lung / other cancer No (Diagnosis within the last 5 years already requiring surveillance chest CT Scans). Smoking Status Former Smoker Former Smokers: Years since quit: 1 year  Quit Date: 2023  Visit Components: Discussion included one or more decision making aids? Yes Discussion included risk/benefits of screening. Yes Discussion included potential follow up diagnostic testing for abnormal scans. Yes Discussion included meaning and risk of over diagnosis. Yes Discussion included meaning and risk of False Positives. Yes Discussion included meaning of total radiation exposure. Yes  Counseling Included: Importance of adherence to annual lung cancer LDCT screening. Yes Impact of comorbidities on ability to participate in the program. Yes Ability and willingness to under diagnostic treatment. Yes  Smoking Cessation Counseling: Former Smokers:  Discussed the importance of maintaining cigarette abstinence. Yes Diagnosis Code: Personal History of Nicotine Dependence. U04.540 Information about tobacco  cessation classes and interventions provided to patient. Yes Written Order for Lung Cancer Screening with LDCT placed in Epic. Yes (CT Chest Lung Cancer Screening Low Dose W/O CM) [id number] Z12.2-Screening of respiratory organs Z87.891-Personal history of nicotine dependence    I spent 25 minutes of face to face time/virtual visit time  with the patient discussing the risks and benefits of lung cancer screening. We took the time to pause at intervals to allow for questions to be asked and answered to ensure understanding. We discussed that they had taken the single most powerful action possible to decrease their risk of developing lung cancer when they quit smoking. I counseled them to remain smoke free, and to contact the office if they ever had the desire to smoke again so that we can provide resources and tools to help support the effort to remain smoke free. We discussed the time and location of the scan, and they  will receive a call or letter with the results within  24-72 hours of receiving them. They have the office contact information in the event they have questions.   They verbalized understanding of all of the above and had no further questions.    I explained to the patient that there has been a high incidence of coronary artery disease noted on these exams. I explained that this is a non-gated exam therefore degree or severity cannot be determined. This patient is on statin therapy. I have asked the patient to follow-up with their PCP regarding any incidental finding of coronary artery disease and management with diet or medication as they feel is clinically indicated. The patient verbalized understanding of the above and had no further questions.      Darcella Gasman Jeromiah Ohalloran, PA-C

## 2023-07-14 NOTE — Patient Instructions (Signed)
Thank you for participating in the [place] [place] [facility] Cancer Screening Program. It was our pleasure to meet you today. We will call you with the results of your scan within the next few days. Your scan will be assigned a Lung RADS category score by the physicians reading the scans.  This Lung RADS score determines follow up scanning.  See below for description of categories, and follow up screening recommendations. We will be in touch to schedule your follow up screening annually or based on recommendations of our providers. We will fax a copy of your scan results to your Primary Care Physician, or the physician who referred you to the program, to ensure they have the results. Please call the office if you have any questions or concerns regarding your scanning experience or results.  Our office number is [phone]. Please speak with Abigail Miyamoto, RN., Karlton Lemon RN, or Pietro Cassis RN. They are  our Lung Cancer Screening RN.'s If They are unavailable when you call, Please leave a message on the voice mail. We will return your call at our earliest convenience.This voice mail is monitored several times a day.  Remember, if your scan is normal, we will scan you annually as long as you continue to meet the criteria for the program. (Age 22-80, Current smoker or smoker who has quit within the last 15 years). If you are a smoker, remember, quitting is the single most powerful action that you can take to decrease your risk of lung cancer and other pulmonary, breathing related problems. We know quitting is hard, and we are here to help.  Please let us know if there is anything we can do to help you meet your goal of quitting. If you are a former smoker, Counselling psychologist. We are proud of you! Remain smoke free! Remember you can refer friends or family members through the number above.  We will screen them to make sure they meet criteria for the program. Thank you for helping [place] take better care of you  by participating in Lung Screening.   Lung RADS Categories:  Lung RADS 1: no nodules or definitely non-concerning nodules.  Recommendation is for a repeat annual scan in 12 months.  Lung RADS 2:  nodules that are non-concerning in appearance and behavior with a very low likelihood of becoming an active cancer. Recommendation is for a repeat annual scan in 12 months.  Lung RADS 3: nodules that are probably non-concerning , includes nodules with a low likelihood of becoming an active cancer.  Recommendation is for a 58-month repeat screening scan. Often noted after an upper respiratory illness. We will be in touch to make sure you have no questions, and to schedule your 90-month scan.  Lung RADS 4 A: nodules with concerning findings, recommendation is most often for a follow up scan in 3 months or additional testing based on our provider's assessment of the scan. We will be in touch to make sure you have no questions and to schedule the recommended 3 month follow up scan.  Lung RADS 4 B:  indicates findings that are concerning. We will be in touch with you to schedule additional diagnostic testing based on our provider's  assessment of the scan.  You can receive free nicotine replacement therapy ( patches, gum or mints) by calling 1-800-QUIT NOW. Please call so we can get you on the path to becoming  a non-smoker. I know it is hard, but you can do this!  Other options for assistance in  smoking cessation ( As covered by your insurance benefits)  Hypnosis for smoking cessation  [employer] [phone]  Acupuncture for smoking cessation  [employer] [phone]

## 2023-07-25 ENCOUNTER — Ambulatory Visit (HOSPITAL_BASED_OUTPATIENT_CLINIC_OR_DEPARTMENT_OTHER)
Admission: RE | Admit: 2023-07-25 | Discharge: 2023-07-25 | Disposition: A | Discharge status: Home / Self Care | Payer: Medicare Other | Source: Ambulatory Visit | Attending: Acute Care | Admitting: Acute Care

## 2023-07-25 DIAGNOSIS — Z87891 Personal history of nicotine dependence: Secondary | ICD-10-CM | POA: Insufficient documentation

## 2023-07-25 DIAGNOSIS — Z122 Encounter for screening for malignant neoplasm of respiratory organs: Secondary | ICD-10-CM | POA: Diagnosis not present

## 2023-07-27 DIAGNOSIS — S76312D Strain of muscle, fascia and tendon of the posterior muscle group at thigh level, left thigh, subsequent encounter: Secondary | ICD-10-CM | POA: Diagnosis not present

## 2023-07-28 ENCOUNTER — Other Ambulatory Visit: Payer: Self-pay

## 2023-08-10 ENCOUNTER — Other Ambulatory Visit: Payer: Self-pay | Admitting: Acute Care

## 2023-08-10 DIAGNOSIS — Z122 Encounter for screening for malignant neoplasm of respiratory organs: Secondary | ICD-10-CM

## 2023-08-10 DIAGNOSIS — Z87891 Personal history of nicotine dependence: Secondary | ICD-10-CM

## 2023-09-13 DIAGNOSIS — H00015 Hordeolum externum left lower eyelid: Secondary | ICD-10-CM | POA: Diagnosis not present

## 2023-09-27 DIAGNOSIS — D696 Thrombocytopenia, unspecified: Secondary | ICD-10-CM | POA: Diagnosis not present

## 2023-09-27 DIAGNOSIS — I10 Essential (primary) hypertension: Secondary | ICD-10-CM | POA: Diagnosis not present

## 2023-09-27 DIAGNOSIS — F17200 Nicotine dependence, unspecified, uncomplicated: Secondary | ICD-10-CM | POA: Diagnosis not present

## 2023-09-27 DIAGNOSIS — E1169 Type 2 diabetes mellitus with other specified complication: Secondary | ICD-10-CM | POA: Diagnosis not present

## 2023-09-27 DIAGNOSIS — E785 Hyperlipidemia, unspecified: Secondary | ICD-10-CM | POA: Diagnosis not present

## 2023-09-27 DIAGNOSIS — M545 Low back pain, unspecified: Secondary | ICD-10-CM | POA: Diagnosis not present

## 2023-09-27 DIAGNOSIS — Z789 Other specified health status: Secondary | ICD-10-CM | POA: Diagnosis not present

## 2023-09-27 DIAGNOSIS — I2510 Atherosclerotic heart disease of native coronary artery without angina pectoris: Secondary | ICD-10-CM | POA: Diagnosis not present

## 2023-09-27 DIAGNOSIS — M791 Myalgia, unspecified site: Secondary | ICD-10-CM | POA: Diagnosis not present

## 2023-09-27 DIAGNOSIS — E559 Vitamin D deficiency, unspecified: Secondary | ICD-10-CM | POA: Diagnosis not present

## 2023-10-26 ENCOUNTER — Other Ambulatory Visit: Payer: Self-pay | Admitting: Internal Medicine

## 2023-11-08 ENCOUNTER — Encounter: Payer: Self-pay | Admitting: Physician Assistant

## 2023-11-08 ENCOUNTER — Ambulatory Visit: Payer: Medicare Other | Attending: Physician Assistant | Admitting: Physician Assistant

## 2023-11-08 VITALS — BP 98/78 | HR 67 | Ht 63.5 in | Wt 174.8 lb

## 2023-11-08 DIAGNOSIS — R079 Chest pain, unspecified: Secondary | ICD-10-CM | POA: Diagnosis not present

## 2023-11-08 DIAGNOSIS — I2510 Atherosclerotic heart disease of native coronary artery without angina pectoris: Secondary | ICD-10-CM

## 2023-11-08 DIAGNOSIS — Z79899 Other long term (current) drug therapy: Secondary | ICD-10-CM | POA: Diagnosis not present

## 2023-11-08 DIAGNOSIS — R0602 Shortness of breath: Secondary | ICD-10-CM | POA: Diagnosis not present

## 2023-11-08 DIAGNOSIS — E782 Mixed hyperlipidemia: Secondary | ICD-10-CM

## 2023-11-08 DIAGNOSIS — G4733 Obstructive sleep apnea (adult) (pediatric): Secondary | ICD-10-CM

## 2023-11-08 DIAGNOSIS — I10 Essential (primary) hypertension: Secondary | ICD-10-CM | POA: Diagnosis not present

## 2023-11-08 MED ORDER — OLMESARTAN MEDOXOMIL 20 MG PO TABS
20.0000 mg | ORAL_TABLET | Freq: Every day | ORAL | 3 refills | Status: AC
Start: 2023-11-08 — End: ?

## 2023-11-08 MED ORDER — CLOPIDOGREL BISULFATE 75 MG PO TABS: 75.0000 mg | ORAL_TABLET | Freq: Every day | ORAL | 3 refills | Status: AC

## 2023-11-08 NOTE — Patient Instructions (Addendum)
 Medication Instructions:  Decrease Olmesartan to 20 mg once daily  *If you need a refill on your cardiac medications before your next appointment, please call your pharmacy*   Lab Work: FLT, Fasting lipid panel If you have labs (blood work) drawn today and your tests are completely normal, you will receive your results only by: MyChart Message (if you have MyChart) OR A paper copy in the mail If you have any lab test that is abnormal or we need to change your treatment, we will call you to review the results.   Testing/Procedures: Exercise Myoview Stress Test Your physician has requested that you have en exercise stress myoview. For further information please visit [email or web address] Please follow instruction sheet, as given.   Follow-Up: At [facility], you and your health needs are our priority.  As part of our continuing mission to provide you with exceptional heart care, we have created designated Provider Care Teams.  These Care Teams include your primary Cardiologist (physician) and Advanced Practice Providers (APPs -  Physician Assistants and Nurse Practitioners) who all work together to provide you with the care you need, when you need it.  We recommend signing up for the patient portal called "MyChart".  Sign up information is provided on this After Visit Summary.  MyChart is used to connect with patients for Virtual Visits (Telemedicine).  Patients are able to view lab/test results, encounter notes, upcoming appointments, etc.  Non-urgent messages can be sent to your provider as well.   To learn more about what you can do with MyChart, go to [email or web address].    Your next appointment:   3 weeks  Provider:   APP  Other Instructions    1st Floor: - Lobby - Registration  - Pharmacy  - Lab - Cafe  2nd Floor: - PV Lab - Diagnostic Testing (echo, CT, nuclear med)  3rd Floor: - Vacant  4th Floor: - TCTS (cardiothoracic surgery) - AFib Clinic -  Structural Heart Clinic - Vascular Surgery  - Vascular Ultrasound  5th Floor: - HeartCare Cardiology (general and EP) - Clinical Pharmacy for coumadin, hypertension, lipid, weight-loss medications, and med management appointments    Valet parking services will be available as well.

## 2023-11-08 NOTE — Progress Notes (Signed)
 Cardiology Office Note:  .   Date:  11/08/2023  ID:  Rebecca Dalton, DOB 18-May-1961, MRN [id number] PCP: Laurann Montana, MD  [facility] Providers Cardiologist:  Dietrich Pates, MD {  History of Present Illness: Marland Kitchen   Rebecca Dalton is a 63 y.o. female with a past medical history of CAD status post DES to LAD, HTN, and HLD.  She is here for annual follow-up visit  History includes stress test 09/2016 which was considered high risk.  Follow-up catheter 9% lesion status post PTCA and DES.  Normal LV function.  Brilinta switched to Effient due to breathing issues.  Later changed to Plavix with plan to keep long-term along with aspirin. Performed counseling.  And states she was walking about 1.5 miles 4 times a week.  She is feeling okay When walking up hills.  Last couple months with hills having some shortness of breath.  She could walk up hills 3 months prior.  Has intermittent chest heaviness as well.  Says that she feels like she wants to cough.  No PND, syncope, palpitations, lower extremity edema.  January 2024 she stopped taking HCTZ and symptoms started roughly 3 weeks after that.  Today, she presents with a history of coronary disease and early-stage COPD with shortness of breath that occurs primarily when walking outside or uphill. The patient does not experience this symptom during her thrice-weekly exercise program, which includes activities such as jumping jacks, weight lifting, and kickboxing. The patient notes a heavy wheezing sensation that seems to originate from the upper chest. The patient has been managing her medications based on her morning blood pressure readings, adjusting the doses of carvedilol and HCTZ as needed. The patient also reports some swelling in the ankles.   Reports no chest pain, pressure, or tightness. No edema, orthopnea, PND. Reports no palpitations.   Discussed the use of AI scribe software for clinical note transcription with the patient, who gave verbal  consent to proceed.  ROS: pertinent ROS in HPI  Studies Reviewed: Marland Kitchen   EKG Interpretation Date/Time:  Wednesday November 08 2023 15:37:18 EDT Ventricular Rate:  67 PR Interval:  162 QRS Duration:  96 QT Interval:  422 QTC Calculation: 445 R Axis:   39  Text Interpretation: Normal sinus rhythm Normal ECG When compared with ECG of 07-Oct-2016 03:49, Criteria for Anteroseptal infarct are no longer Present T wave inversion no longer evident in Anterior leads Confirmed by Jari Favre [phone]) on 11/08/2023 3:43:42 PM    Cardiac cath 10/06/2016  Mid RCA lesion, 20 %stenosed. Mid RCA to Dist RCA lesion, 10 %stenosed. Ost Cx to Prox Cx lesion, 20 %stenosed. Ost Ramus to Ramus lesion, 20 %stenosed. A STENT RESOLUTE ONYX 3.0X15 drug eluting stent was successfully placed. Prox LAD to Mid LAD lesion, 99 %stenosed. Post intervention, there is a 0% residual stenosis. The left ventricular systolic function is normal. LV end diastolic pressure is normal. The left ventricular ejection fraction is greater than 65% by visual estimate. There is no mitral valve regurgitation.   1. Unstable angina  2. Severe stenosis mid LAD 3. Mild non-obstructive disease in the RCA and Circumflex 4. Normal LV systolic function 5. Successful PTCA/DES x 1 mid LAD   Recommendations: She will need DAPT with ASA and Brilinta for one year. I will start a statin. Continue beta blocker. Discharge in am.        Physical Exam:   VS:  BP 98/78   Pulse 67   Ht 5' 3.5" (  1.613 m)   Wt 174 lb 12.8 oz (79.3 kg)   SpO2 97%   BMI 30.48 kg/m    Wt Readings from Last 3 Encounters:  11/08/23 174 lb 12.8 oz (79.3 kg)  07/25/23 170 lb (77.1 kg)  11/14/22 170 lb 12.8 oz (77.5 kg)    GEN: Well nourished, well developed in no acute distress NECK: No JVD; No carotid bruits CARDIAC: RRR, no murmurs, rubs, gallops RESPIRATORY:  Clear to auscultation without rales, wheezing or rhonchi  ABDOMEN: Soft, non-tender,  non-distended EXTREMITIES:  No edema; No deformity   ASSESSMENT AND PLAN: .   Exertional Dyspnea Intermittent dyspnea during exertion, possibly pulmonary or cardiac in origin. Stress test recommended for cardiac evaluation. - Order exercise stress test for next Thursday after 11 AM.  Coronary Artery Disease Coronary artery disease with LAD stent since 2018. Concern for disease progression. Stress test preferred over CT due to stent. - Perform exercise stress test to assess for ischemia or myocardial dysfunction. - Consider cardiac catheterization if stress test indicates ischemia or myocardial infarction.  Chronic Obstructive Pulmonary Disease (COPD) Early COPD signs. Wheezing and dyspnea could be COPD-related, but cardiac issues prioritized.  Hypertension Blood pressure management complicated by hypotension and dizziness. Current medications: carvedilol, olmesartan, amlodipine, and HCTZ. Peripheral edema likely due to inconsistent HCTZ use. - Reduce olmesartan dose to 20 mg daily. - Continue carvedilol at current dose. - Monitor blood pressure regularly and adjust medications as needed. - Reassess HCTZ dosage after stress test results.  Hyperlipidemia LDL cholesterol at 84 mg/dL, above target for coronary artery disease. On 10 mg Crestor without issues. - Recheck lipid panel after stress test follow-up.  Follow-up Follow-up needed to review stress test results and adjust treatment. Prefers in-person follow-up. - Schedule follow-up appointment after stress test to review results and discuss management changes. - Recheck lipid panel during follow-up appointment.   Dispo: She can follow-up in 3 weeks with APP  Signed, Sharlene Dory, PA-C

## 2023-11-09 ENCOUNTER — Telehealth (HOSPITAL_COMMUNITY): Payer: Self-pay | Admitting: *Deleted

## 2023-11-09 DIAGNOSIS — D225 Melanocytic nevi of trunk: Secondary | ICD-10-CM | POA: Diagnosis not present

## 2023-11-09 DIAGNOSIS — L538 Other specified erythematous conditions: Secondary | ICD-10-CM | POA: Diagnosis not present

## 2023-11-09 DIAGNOSIS — L821 Other seborrheic keratosis: Secondary | ICD-10-CM | POA: Diagnosis not present

## 2023-11-09 DIAGNOSIS — L820 Inflamed seborrheic keratosis: Secondary | ICD-10-CM | POA: Diagnosis not present

## 2023-11-09 DIAGNOSIS — L814 Other melanin hyperpigmentation: Secondary | ICD-10-CM | POA: Diagnosis not present

## 2023-11-09 NOTE — Telephone Encounter (Signed)
 Patient given detailed instructions per Myocardial Perfusion Study Information Sheet for the test on 11/14/2022 at 1:00. Patient notified to arrive 15 minutes early and that it is imperative to arrive on time for appointment to keep from having the test rescheduled.  If you need to cancel or reschedule your appointment, please call the office within 24 hours of your appointment. . Patient verbalized understanding.[name]

## 2023-11-11 ENCOUNTER — Encounter: Payer: Self-pay | Admitting: Internal Medicine

## 2023-11-13 ENCOUNTER — Telehealth (HOSPITAL_COMMUNITY): Payer: Self-pay | Admitting: Internal Medicine

## 2023-11-13 NOTE — Telephone Encounter (Signed)
 Patient cancelled Myoview for the reason below:  11/13/2023 9:07 AM [id number], OLIVIA B  Cancel Rsn: Patient (Patient does not want to reschedule at this time)   Order will be removed from the active echo/Nuc WQ. Thank you.

## 2023-11-14 ENCOUNTER — Ambulatory Visit (HOSPITAL_COMMUNITY)

## 2023-11-15 ENCOUNTER — Telehealth: Payer: Self-pay | Admitting: Internal Medicine

## 2023-11-15 NOTE — Telephone Encounter (Signed)
 I think a stress test is appropriate now    At last visit I said I would not recomm a CT scan as she has a stent in the L anterior vessel   Keep appt

## 2023-11-15 NOTE — Telephone Encounter (Signed)
 Left a message for the pt to call back..   Her last OV with Dr Tenny Craw 10/2022... she saw Tessa 10/2023... her note:   Exertional Dyspnea Intermittent dyspnea during exertion, possibly pulmonary or cardiac in origin. Stress test recommended for cardiac evaluation. - Order exercise stress test for next Thursday after 11 AM.    Pt has declined stress test according to her My Chart messages... she is asking about a Cath that Dr Tenny Craw mentioned back in 2024.   Will send to Dr Tenny Craw to review all... pt is seeing Robin Searing NP which may have been planned as a post stress test appt.   See previous My Chart messages.

## 2023-11-15 NOTE — Telephone Encounter (Signed)
 Pt calling to f/u on MyChart message that was sent on 3/22 regarding Stress Test and Cath. Please advise

## 2023-11-15 NOTE — Telephone Encounter (Signed)
 After review of Tessas note I would recomm proceeding with exercise stress test  Will get info on exercise tolerance as well as perfusion data I wouldn't go straight to L heart cath

## 2023-11-16 ENCOUNTER — Other Ambulatory Visit (HOSPITAL_COMMUNITY): Payer: Self-pay | Admitting: Physician Assistant

## 2023-11-16 DIAGNOSIS — R0602 Shortness of breath: Secondary | ICD-10-CM

## 2023-11-16 NOTE — Telephone Encounter (Signed)
 Called and spoke with patient who states she will go through with stress test after hearing Dr Tenny Craw' comments. She said that at last year's visit ross had said she would go straight to cath, rather than stress, which is why she was hesitating. Additionally, she fell sick two days prior to the date that the test was scheduled. She will call imaging now and set it up.

## 2023-11-16 NOTE — Telephone Encounter (Signed)
 Pt returning nurse call in regards to My Chart message. Please advise

## 2023-11-17 ENCOUNTER — Encounter (HOSPITAL_COMMUNITY): Payer: Self-pay

## 2023-11-24 ENCOUNTER — Ambulatory Visit (HOSPITAL_COMMUNITY): Attending: Cardiovascular Disease

## 2023-11-24 DIAGNOSIS — R0602 Shortness of breath: Secondary | ICD-10-CM | POA: Insufficient documentation

## 2023-11-24 LAB — MYOCARDIAL PERFUSION IMAGING
Angina Index: 0
Duke Treadmill Score: 6
Estimated workload: 7
Exercise duration (min): 5 min
Exercise duration (sec): 30 s
LV dias vol: 59 mL (ref 46–106)
LV sys vol: 20 mL
MPHR: 156 {beats}/min
Nuc Stress EF: 67 %
Peak HR: 137 {beats}/min
Percent HR: 87 %
Rest HR: 69 {beats}/min
Rest Nuclear Isotope Dose: 9.9 mCi
SDS: 0
SRS: 0
SSS: 0
ST Depression (mm): 0 mm
Stress Nuclear Isotope Dose: 31.6 mCi
TID: 0.95

## 2023-11-24 MED ORDER — TECHNETIUM TC 99M TETROFOSMIN IV KIT
31.6000 | PACK | Freq: Once | INTRAVENOUS | Status: AC | PRN
  Administered 2023-11-24: 31.6 via INTRAVENOUS

## 2023-11-24 MED ORDER — TECHNETIUM TC 99M TETROFOSMIN IV KIT
9.9000 | PACK | Freq: Once | INTRAVENOUS | Status: AC | PRN
Start: 2023-11-24 — End: 2023-11-24
  Administered 2023-11-24: 9.9 via INTRAVENOUS

## 2023-11-29 ENCOUNTER — Encounter: Payer: Self-pay | Admitting: Physician Assistant

## 2023-12-03 NOTE — Progress Notes (Deleted)
 Cardiology Office Note    Patient Name: Rebecca Dalton Date of Encounter: 12/03/2023  Primary Care Provider:  Victorio Grave, MD Primary Cardiologist:  Ola Berger, MD Primary Electrophysiologist: None   Past Medical History    Past Medical History:  Diagnosis Date   Abnormal cardiovascular stress test 10/07/2016   Anxiety    CAD in native artery 10/07/2016   Chronic bronchitis (HCC)    Coronary artery disease    GERD (gastroesophageal reflux disease)    HLD (hyperlipidemia) 10/07/2016   HTN (hypertension) 10/07/2016   Hypertension    Pneumonia    "twice" (10/06/2016)   S/P angioplasty with stent, 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/06/16 10/07/2016    History of Present Illness  Rebecca Dalton is a 63 y.o. female with a PMH of CAD s/p PCI/DES to M LAD, HLD, HTN history of tobacco abuse, COPD who presents today for 1 month follow-up.  Rebecca Dalton seen initially by Dr. Avanell Bob in 2018 for complaint of shortness of breath and jaw pain.  She completed a stress test 09/2016 that was high risk and underwent LHC that showed 99% stenosed LAD treated with PCI/DES x 1.  She continued to do well but noted shortness of breath with walking up hills with intermittent chest heaviness.  She was last seen on 11/08/2023 by Lovette Rud, PA with ongoing complaint.  She underwent a Lexiscan Myoview that showed normal heart pump function and no evidence of ischemia.  Patient denies chest pain, palpitations, dyspnea, PND, orthopnea, nausea, vomiting, dizziness, syncope, edema, weight gain, or early satiety.   Discussed the use of AI scribe software for clinical note transcription with the patient, who gave verbal consent to proceed.  History of Present Illness    ***Notes: -Last ischemic evaluation:  Review of Systems  Please see the history of present illness.    All other systems reviewed and are otherwise negative except as noted above.  Physical Exam    Wt Readings from Last 3 Encounters:  11/24/23 174 lb (78.9 kg)   11/08/23 174 lb 12.8 oz (79.3 kg)  07/25/23 170 lb (77.1 kg)   WJ:XBJYN were no vitals filed for this visit.,There is no height or weight on file to calculate BMI. GEN: Well nourished, well developed in no acute distress Neck: No JVD; No carotid bruits Pulmonary: Clear to auscultation without rales, wheezing or rhonchi  Cardiovascular: Normal rate. Regular rhythm. Normal S1. Normal S2.   Murmurs: There is no murmur.  ABDOMEN: Soft, non-tender, non-distended EXTREMITIES:  No edema; No deformity   EKG/LABS/ Recent Cardiac Studies   ECG personally reviewed by me today - ***  Risk Assessment/Calculations:   {Does this patient have ATRIAL FIBRILLATION?[phone]}      Lab Results  Component Value Date   WBC 5.5 11/14/2022   HGB 13.6 11/14/2022   HCT 39.9 11/14/2022   MCV 97 11/14/2022   PLT 143 (L) 11/14/2022   Lab Results  Component Value Date   CREATININE 0.82 11/14/2022   BUN 15 11/14/2022   NA 142 11/14/2022   K 4.6 11/14/2022   CL 101 11/14/2022   CO2 24 11/14/2022   Lab Results  Component Value Date   CHOL 127 11/07/2019   HDL 41 11/07/2019   LDLCALC 59 11/07/2019   TRIG 155 (H) 11/07/2019   CHOLHDL 3.1 11/07/2019    No results found for: "HGBA1C" Assessment & Plan    1.  Coronary artery disease:  2.  Shortness of breath  3.  History of  COPD  4.  Essential hypertension  5.  Hyperlipidemia      Disposition: Follow-up with Ola Berger, MD or APP in *** months {Are you ordering a CV Procedure (e.g. stress test, cath, DCCV, TEE, etc)?   Press F2        :[id number]}   Signed, Francene Ing, Retha Cast, NP 12/03/2023, 2:30 PM [facility]

## 2023-12-04 ENCOUNTER — Ambulatory Visit: Admitting: Nurse Practitioner

## 2023-12-04 DIAGNOSIS — J449 Chronic obstructive pulmonary disease, unspecified: Secondary | ICD-10-CM

## 2023-12-04 DIAGNOSIS — I2510 Atherosclerotic heart disease of native coronary artery without angina pectoris: Secondary | ICD-10-CM

## 2023-12-04 DIAGNOSIS — R0602 Shortness of breath: Secondary | ICD-10-CM

## 2023-12-04 DIAGNOSIS — E782 Mixed hyperlipidemia: Secondary | ICD-10-CM

## 2023-12-04 DIAGNOSIS — I10 Essential (primary) hypertension: Secondary | ICD-10-CM

## 2023-12-08 ENCOUNTER — Other Ambulatory Visit (HOSPITAL_COMMUNITY)

## 2023-12-20 DIAGNOSIS — H2513 Age-related nuclear cataract, bilateral: Secondary | ICD-10-CM | POA: Diagnosis not present

## 2023-12-20 DIAGNOSIS — H52222 Regular astigmatism, left eye: Secondary | ICD-10-CM | POA: Diagnosis not present

## 2023-12-20 DIAGNOSIS — H53143 Visual discomfort, bilateral: Secondary | ICD-10-CM | POA: Diagnosis not present

## 2023-12-26 ENCOUNTER — Ambulatory Visit: Attending: Emergency Medicine | Admitting: Emergency Medicine

## 2023-12-26 ENCOUNTER — Encounter: Payer: Self-pay | Admitting: Emergency Medicine

## 2023-12-26 VITALS — BP 134/74 | HR 82 | Ht 63.0 in | Wt 174.2 lb

## 2023-12-26 DIAGNOSIS — E785 Hyperlipidemia, unspecified: Secondary | ICD-10-CM | POA: Diagnosis not present

## 2023-12-26 DIAGNOSIS — I2510 Atherosclerotic heart disease of native coronary artery without angina pectoris: Secondary | ICD-10-CM

## 2023-12-26 DIAGNOSIS — R0602 Shortness of breath: Secondary | ICD-10-CM | POA: Diagnosis not present

## 2023-12-26 DIAGNOSIS — I10 Essential (primary) hypertension: Secondary | ICD-10-CM

## 2023-12-26 NOTE — Progress Notes (Signed)
 Cardiology Office Note:    Date:  12/26/2023  ID:  Rebecca Dalton, DOB September 22, 1960, MRN [id number] PCP: Rebecca Grave, MD  [facility] Providers Cardiologist:  Ola Berger, MD       Patient Profile:      Chief Complaint: Follow-up for dyspnea on exertion History of Present Illness:  Rebecca Dalton is a 63 y.o. female with visit-pertinent history of CAD s/p PCI/DES to M LAD, HLD, HTN history of tobacco abuse, COPD  Rebecca Dalton seen initially by Dr. Avanell Bob in 2018 for complaint of shortness of breath and jaw pain.  She completed a stress test 09/2016 that was high risk and underwent LHC that showed 99% stenosed LAD treated with PCI/DES x1.  She continued to do well but noted shortness of breath with walking up hills with intermittent chest heaviness.  She was last seen on 11/08/2023 by Lovette Rud, PA with ongoing exertional dyspnea.  Her blood pressure was 98/70 with associated dizziness.  Her olmesartan  dose was reduced to 20 mg daily.  She underwent a Lexiscan Myoview  that showed normal heart pump function and no evidence of ischemia.   Discussed the use of AI scribe software for clinical note transcription with the patient, who gave verbal consent to proceed.  History of Present Illness Rebecca Dalton is a 63 year old female who presents with shortness of breath and wheezing over the past year.  She continues to experience shortness of breath and wheezing during exertion, which have worsened over the past year. These symptoms occur during activities such as working outside, exercising, or carrying items.  She is without any SOB at rest, orthopnea, PND.  She notes she will occasionally get some chest tightness when she becomes very short of breath.  She is able to rest and her shortness of breath resolves relatively quickly.  She has a significant smoking history of 30 to 40 years, quitting three years ago. A recent CT scan indicated possible COPD with emphysema-related changes, though no  definitive diagnosis has been made.  Her current medications include carvedilol , olmesartan , hydrochlorothiazide, and amlodipine  for blood pressure management. She monitors her blood pressure regularly, noting occasional high readings, but generally maintains it within a normal range.  Review of systems:  Please see the history of present illness. All other systems are reviewed and otherwise negative.     Home Medications:    Current Meds  Medication Sig   amLODipine  (NORVASC ) 2.5 MG tablet Take 2.5 mg by mouth daily.   aspirin  EC 81 MG tablet Take 1 tablet (81 mg total) by mouth daily.   BIOTIN PO Take 1 tablet by mouth daily.   carvedilol  (COREG ) 25 MG tablet Take 25 mg by mouth 2 (two) times daily with a meal.   Cholecalciferol (VITAMIN D PO) Take 3 capsules by mouth once a week.   clopidogrel  (PLAVIX ) 75 MG tablet Take 1 tablet (75 mg total) by mouth daily.   CRESTOR 10 MG tablet Take 10 mg by mouth daily.   cyclobenzaprine (FLEXERIL) 10 MG tablet Take 5-10 mg by mouth daily as needed for muscle spasms   hydrochlorothiazide (MICROZIDE) 12.5 MG capsule Take 12.5 mg by mouth daily.   levofloxacin (LEVAQUIN) 500 MG tablet Take 500 mg by mouth daily.   LORazepam  (ATIVAN ) 0.5 MG tablet Take 0.5 mg by mouth daily as needed.    MAGNESIUM PO Take 1 tablet by mouth 4 (four) times a week.   nitroGLYCERIN  (NITROSTAT ) 0.4 MG SL tablet Place 1  tablet (0.4 mg total) under the tongue every 5 (five) minutes as needed for chest pain.   olmesartan  (BENICAR ) 20 MG tablet Take 1 tablet (20 mg total) by mouth daily.   polyethylene glycol (MIRALAX  / GLYCOLAX ) packet Take 17 g by mouth daily.    Probiotic Product (PROBIOTIC PO) Take 1 capsule by mouth daily.   Studies Reviewed:       Lexiscan Myoview  11/24/2023   The study is normal. The study is low risk.   No ST deviation was noted.   LV perfusion is normal. There is no evidence of ischemia. There is no evidence of infarction.   Left ventricular  function is normal. Nuclear stress EF: 67%. The left ventricular ejection fraction is hyperdynamic (>65%). End diastolic cavity size is normal. End systolic cavity size is normal.   Prior study available for comparison from 10/06/2016. Prior to LAD stenting had ischemic ECG changes and LAD perfusion defect now gone  Risk Assessment/Calculations:             Physical Exam:   VS:  BP 134/74 (BP Location: Left Arm, Patient Position: Sitting, Cuff Size: Normal)   Pulse 82   Ht 5\' 3"  (1.6 m)   Wt 174 lb 3.2 oz (79 kg)   SpO2 98%   BMI 30.86 kg/m    Wt Readings from Last 3 Encounters:  12/26/23 174 lb 3.2 oz (79 kg)  11/24/23 174 lb (78.9 kg)  11/08/23 174 lb 12.8 oz (79.3 kg)    GEN: Well nourished, well developed in no acute distress NECK: No JVD; No carotid bruits CARDIAC: RRR, no murmurs, rubs, gallops RESPIRATORY:  Clear to auscultation without rales, wheezing or rhonchi  ABDOMEN: Soft, non-tender, non-distended EXTREMITIES:  No edema; No acute deformity     Assessment and Plan:  Exertional dyspnea Lexiscan Myoview  11/24/2023 was normal, low risk study with no evidence of ischemia or infarction - She continues to have ongoing dyspnea on exertion and wheezing for the past year that has been gradually worsening.  Her DOE occurs on activities such as exercising or working outside.  DOE resolves quickly with rest. - She is a long-term smoker of at least 40 years.  Recently quit 3 years ago.  Her recent CT chest lung cancer screening showed emphysema - Today I will refer patient to pulmonology for further evaluation of dyspnea as I think she will benefit from PFTs - Today I will order echocardiogram as well to evaluate LV function and valvular abnormalities  Hypertension Blood pressure today is well-controlled at 134/74 - Continue amlodipine  2.5 mg daily, carvedilol  25 mg twice daily, hydrochlorothiazide 12.5 mg daily, and olmesartan  20 mg daily - Continue to monitor BP at  home  Coronary artery disease  LHC in 2018 showed 99% stenosis in the mid LAD treated with PCI/DES x1 Lexiscan Myoview  11/2023 was normal study with no evidence of ischemia or infarction - Today she is without any anginal symptoms, no indication for further ischemic evaluation at this time - Continue aspirin  81 mg daily, plavix  75 mg daily, and rosuvastatin 10 mg daily  Hyperlipidemia LDL cholesterol at 84 and above goal of less than 70 - Fasting lipid panel has been ordered  - Continue rosuvastatin 10 mg daily     Dispo:  Return in about 6 months (around 06/27/2024).  Signed, Ava Boatman, NP

## 2023-12-26 NOTE — Patient Instructions (Signed)
 Medication Instructions:  NO CHANGES   Lab Work: NONE   Testing/Procedures: Your physician has requested that you have an echocardiogram. Echocardiography is a painless test that uses sound waves to create images of your heart. It provides your doctor with information about the size and shape of your heart and how well your heart's chambers and valves are working. This procedure takes approximately one hour. There are no restrictions for this procedure. Please do NOT wear cologne, perfume, aftershave, or lotions (deodorant is allowed). Please arrive 15 minutes prior to your appointment time.  Please note: We ask at that you not bring children with you during ultrasound (echo/ vascular) testing. Due to room size and safety concerns, children are not allowed in the ultrasound rooms during exams. Our front office staff cannot provide observation of children in our lobby area while testing is being conducted. An adult accompanying a patient to their appointment will only be allowed in the ultrasound room at the discretion of the ultrasound technician under special circumstances. We apologize for any inconvenience.   Follow-Up: At [facility], you and your health needs are our priority.  As part of our continuing mission to provide you with exceptional heart care, our providers are all part of one team.  This team includes your primary Cardiologist (physician) and Advanced Practice Providers or APPs (Physician Assistants and Nurse Practitioners) who all work together to provide you with the care you need, when you need it.  Your next appointment:   6 MONTHS  Provider:   MADISON FOUNTAIN, DNP   We recommend signing up for the patient portal called "MyChart".  Sign up information is provided on this After Visit Summary.  MyChart is used to connect with patients for Virtual Visits (Telemedicine).  Patients are able to view lab/test results, encounter notes, upcoming appointments, etc.   Non-urgent messages can be sent to your provider as well.   To learn more about what you can do with MyChart, go to [email or web address].

## 2024-01-18 ENCOUNTER — Ambulatory Visit: Admitting: Pulmonary Disease

## 2024-01-18 ENCOUNTER — Encounter: Payer: Self-pay | Admitting: Pulmonary Disease

## 2024-01-18 VITALS — BP 136/85 | HR 72 | Ht 63.5 in | Wt 175.0 lb

## 2024-01-18 DIAGNOSIS — Z87891 Personal history of nicotine dependence: Secondary | ICD-10-CM | POA: Diagnosis not present

## 2024-01-18 DIAGNOSIS — J431 Panlobular emphysema: Secondary | ICD-10-CM

## 2024-01-18 DIAGNOSIS — R0602 Shortness of breath: Secondary | ICD-10-CM

## 2024-01-18 MED ORDER — ALBUTEROL SULFATE HFA 108 (90 BASE) MCG/ACT IN AERS: 2.0000 | INHALATION_SPRAY | Freq: Four times a day (QID) | RESPIRATORY_TRACT | 6 refills | Status: AC | PRN

## 2024-01-18 MED ORDER — TRELEGY ELLIPTA 100-62.5-25 MCG/ACT IN AEPB: 1.0000 | INHALATION_SPRAY | Freq: Every day | RESPIRATORY_TRACT | 3 refills | Status: DC

## 2024-01-18 NOTE — Patient Instructions (Addendum)
 Schedule for pulmonary function test  Prescription for Trelegy sent into pharmacy  Prescription for prednisone 10 mg daily for 5 to 7 days -This is low-dose prednisone to help with any irritation/swelling in the airway  Albuterol to be used as needed prior to activity or if you already feeling short of breath  Follow-up in about 6 weeks  There is evidence of emphysema on your CAT scan

## 2024-01-18 NOTE — Progress Notes (Signed)
 Rebecca Dalton    [id number]    03-25-1961  Primary Care Physician:White, Adah Acron, MD  Referring Physician: Ava Boatman, NP [address] [place],  [place] [address]  Chief complaint:    Patient with shortness of breath, wheezing HPI:  Sensation of shortness of breath wheezing, heaviness in her upper part of her chest  Has had no recent surgery, no recent intubations  She feels she can move air down in her lungs but feel obstructed in the upper part of her airway  There was concern for exercise-induced asthma as symptoms usually occur following activity No history of asthma growing up  Quit smoking in 2018  Has a past history of anterior cervical discectomy about 15 years ago Sometimes has some abnormal sensation around her neck when her neck is positioned in a certain way  Was treated for multiple bout of bronchitis about a year ago she used up to 4 courses of antibiotics Currently does not feel acutely ill, no fevers, no chills no significant cough  She does use albuterol  as needed  Outpatient Encounter Medications as of 01/18/2024  Medication Sig   albuterol  (VENTOLIN  HFA) 108 (90 Base) MCG/ACT inhaler Inhale 2 puffs into the lungs every 6 (six) hours as needed for wheezing or shortness of breath.   amLODipine  (NORVASC ) 2.5 MG tablet Take 2.5 mg by mouth daily.   aspirin  EC 81 MG tablet Take 1 tablet (81 mg total) by mouth daily.   BIOTIN PO Take 1 tablet by mouth daily.   carvedilol  (COREG ) 25 MG tablet Take 25 mg by mouth 2 (two) times daily with a meal.   Cholecalciferol (VITAMIN D PO) Take 3 capsules by mouth once a week.   clopidogrel  (PLAVIX ) 75 MG tablet Take 1 tablet (75 mg total) by mouth daily.   CRESTOR 10 MG tablet Take 10 mg by mouth daily.   cyclobenzaprine (FLEXERIL) 10 MG tablet Take 5-10 mg by mouth daily as needed for muscle spasms   Fluticasone-Umeclidin-Vilant (TRELEGY ELLIPTA ) 100-62.5-25 MCG/ACT AEPB Inhale 1 puff into the  lungs daily.   hydrochlorothiazide (MICROZIDE) 12.5 MG capsule Take 12.5 mg by mouth daily.   ketotifen (ZADITOR) 0.025 % ophthalmic solution Place 1 drop into both eyes as needed (for allergies).   LORazepam  (ATIVAN ) 0.5 MG tablet Take 0.5 mg by mouth daily as needed.    MAGNESIUM PO Take 1 tablet by mouth 4 (four) times a week.   nitroGLYCERIN  (NITROSTAT ) 0.4 MG SL tablet Place 1 tablet (0.4 mg total) under the tongue every 5 (five) minutes as needed for chest pain.   olmesartan  (BENICAR ) 20 MG tablet Take 1 tablet (20 mg total) by mouth daily.   polyethylene glycol (MIRALAX  / GLYCOLAX ) packet Take 17 g by mouth daily.    Probiotic Product (PROBIOTIC PO) Take 1 capsule by mouth daily.   levofloxacin (LEVAQUIN) 500 MG tablet Take 500 mg by mouth daily.   No facility-administered encounter medications on file as of 01/18/2024.    Allergies as of 01/18/2024 - Review Complete 01/18/2024  Allergen Reaction Noted   Penicillin g Shortness Of Breath 09/25/2015   Penicillins Shortness Of Breath 04/18/2016   Betamethasone  Other (See Comments) 10/17/2014   Codeine Nausea Only 03/01/2013   Sulfa antibiotics Hives 09/03/2010   Sulfamethoxazole Other (See Comments) 09/25/2015   Sulfasalazine Hives 09/03/2010   Latex Rash 03/27/2017    Past Medical History:  Diagnosis Date   Abnormal cardiovascular stress test 10/07/2016  Anxiety    CAD in native artery 10/07/2016   Chronic bronchitis (HCC)    Coronary artery disease    GERD (gastroesophageal reflux disease)    HLD (hyperlipidemia) 10/07/2016   HTN (hypertension) 10/07/2016   Hypertension    Pneumonia    "twice" (10/06/2016)   S/P angioplasty with stent, 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/06/16 10/07/2016    Past Surgical History:  Procedure Laterality Date   ANTERIOR CERVICAL DECOMP/DISCECTOMY FUSION  ~ 2005   C6-7   BACK SURGERY     CORONARY ANGIOPLASTY WITH STENT PLACEMENT  10/06/2016   CORONARY STENT INTERVENTION N/A 10/06/2016   Procedure: Coronary  Stent Intervention;  Surgeon: Odie Benne, MD;  Location: [facility];  Service: Cardiovascular;  Laterality: N/A;   ELBOW ARTHROSCOPY Right 05/03/2016   Procedure: RIGHT ARTHROSCOPY ELBOW WITH REMOVAL FOREIGN BODIES;  Surgeon: Brunilda Capra, MD;  Location: [facility];  Service: Orthopedics;  Laterality: Right;   LEFT HEART CATH AND CORONARY ANGIOGRAPHY N/A 10/06/2016   Procedure: Left Heart Cath and Coronary Angiography;  Surgeon: Odie Benne, MD;  Location: [facility];  Service: Cardiovascular;  Laterality: N/A;   REFRACTIVE SURGERY Bilateral    TONSILLECTOMY      Family History  Problem Relation Age of Onset   Heart disease Father    Heart attack Father 39   Heart attack Sister 5   Diabetes Sister    Heart attack Brother 24   Heart attack Maternal Grandmother 54    Social History   Socioeconomic History   Marital status: Married    Spouse name: Not on file   Number of children: Not on file   Years of education: Not on file   Highest education level: Not on file  Occupational History   Not on file  Tobacco Use   Smoking status: Light Smoker    Current packs/day: 0.00    Average packs/day: 0.5 packs/day for 39.0 years (19.5 ttl pk-yrs)    Types: Cigarettes    Start date: 10/06/1977    Last attempt to quit: 10/06/2016    Years since quitting: 7.2   Smokeless tobacco: Never  Substance and Sexual Activity   Alcohol use: Yes    Alcohol/week: 21.0 standard drinks of alcohol    Types: 6 Cans of beer, 15 Shots of liquor per week    Comment: 10/06/2016 "3, 1 shot mixed drinks or 3 beers/day; mostly mixed drinks"   Drug use: No   Sexual activity: Not Currently  Other Topics Concern   Not on file  Social History Narrative   Not on file   Social Drivers of Health   Financial Resource Strain: Not on file  Food Insecurity: Not on file  Transportation Needs: Not on file  Physical Activity: Not on file  Stress: Not on file   Social Connections: Not on file  Intimate Partner Violence: Not on file    Review of Systems  Respiratory:  Positive for cough and wheezing.     Vitals:   01/18/24 1348  BP: 136/85  Pulse: 72  SpO2: 97%     Physical Exam Constitutional:      Appearance: She is obese.  HENT:     Head: Normocephalic and atraumatic.     Mouth/Throat:     Mouth: Mucous membranes are moist.  Eyes:     Pupils: Pupils are equal, round, and reactive to light.  Cardiovascular:     Rate and Rhythm: Normal rate and regular  rhythm.     Heart sounds: No murmur heard.    No friction rub.  Pulmonary:     Effort: No respiratory distress.     Breath sounds: No stridor. No wheezing or rhonchi.  Musculoskeletal:     Cervical back: Normal range of motion. No rigidity.  Neurological:     Mental Status: She is alert.  Psychiatric:        Mood and Affect: Mood normal.    Data Reviewed: Low-dose CT scan-CT chest reviewed showing evidence of centrilobular emphysema-reviewed by myself  No PFT on record  Myocardial perfusion stress test was normal 11/24/2023  Assessment:  Shortness of breath,  Concern for exercise-induced asthma  Emphysema noted on CT scan  Plan/Recommendations: Prescription for Trelegy-Trelegy 100  Obtain pulmonary function test - Looking for evidence of obstruction, expiratory loop flattening  Prescription for prednisone 10 mg daily for 5 to 7 days  Continue albuterol  as needed - This may be used prior to activity-should help exercise-induced limitations - May be used as needed for wheezing  Follow-up in about 6 weeks  Call with significant concerns   Myer Artis MD [facility] 01/18/2024, 7:53 PM  CC: Ava Boatman, NP

## 2024-01-25 ENCOUNTER — Telehealth: Payer: Self-pay

## 2024-01-25 ENCOUNTER — Telehealth (HOSPITAL_COMMUNITY): Payer: Self-pay | Admitting: Emergency Medicine

## 2024-01-25 ENCOUNTER — Other Ambulatory Visit (HOSPITAL_COMMUNITY): Payer: Self-pay

## 2024-01-25 MED ORDER — PREDNISONE 10 MG PO TABS: 10.0000 mg | ORAL_TABLET | Freq: Every day | ORAL | 0 refills | Status: AC

## 2024-01-25 NOTE — Telephone Encounter (Signed)
 Patient called and cancelled echocardiogram due to she is having multiple lung test and wants to wait to see hoe they turn out. Patient will call back if she decides to have echo. Order will be removed from the echo WQ. Thank you.

## 2024-01-25 NOTE — Telephone Encounter (Signed)
 Per test claims patient has a deductible to meet. Both options of Triple Therapy are showing as $270.64, after the deductible is met this price should go down to $47.00

## 2024-01-25 NOTE — Telephone Encounter (Signed)
 Copied from CRM [id number]. Topic: Clinical - Prescription Issue >> Jan 25, 2024  8:22 AM Rebecca Dalton wrote: Reason for CRM: Patient states pharmacy said Prednisone was never called in. Also, Trelegy is going to cost her $250 - she wont be able to afford that, requesting Dalton generic.  I called and spoke to pt. Pt states she never received the Rx from the Prednisone taper from her appointment last week. Pt also states that her Trelegy will be $250 out of pocket. I will send in the Prednisone RX. Routing to the RX team to see what inhaler could be cheaper through insurance. Sending FYI to Dr Gaynell Keeler. Pt would like the RX's sent to [facility] on [address].

## 2024-02-01 DIAGNOSIS — Z03818 Encounter for observation for suspected exposure to other biological agents ruled out: Secondary | ICD-10-CM | POA: Diagnosis not present

## 2024-02-01 DIAGNOSIS — J029 Acute pharyngitis, unspecified: Secondary | ICD-10-CM | POA: Diagnosis not present

## 2024-02-01 DIAGNOSIS — R051 Acute cough: Secondary | ICD-10-CM | POA: Diagnosis not present

## 2024-02-01 DIAGNOSIS — R509 Fever, unspecified: Secondary | ICD-10-CM | POA: Diagnosis not present

## 2024-02-05 MED ORDER — TRELEGY ELLIPTA 100-62.5-25 MCG/ACT IN AEPB
1.0000 | INHALATION_SPRAY | Freq: Every day | RESPIRATORY_TRACT | 3 refills | Status: AC
Start: 2024-02-05 — End: ?

## 2024-02-05 NOTE — Telephone Encounter (Signed)
 Patient is returning a call from nurse Ashlyn . Calling cal to see if nurse Verl Glatter is available . Got patient over to Ms. Tanessa

## 2024-02-05 NOTE — Telephone Encounter (Signed)
 Pt called me back. I informed pt of the Rx team note. Pt stated she needs a RX of Trelegy called in as she did not pick up the RX from May due to waiting to hear back from our office. I reviewed the pts preferred pharmacy. I will call this in. NFN

## 2024-02-05 NOTE — Telephone Encounter (Signed)
 ATC x1. Lmtcb

## 2024-03-05 ENCOUNTER — Other Ambulatory Visit (HOSPITAL_COMMUNITY)

## 2024-03-05 ENCOUNTER — Ambulatory Visit: Admitting: Pulmonary Disease

## 2024-03-05 DIAGNOSIS — J431 Panlobular emphysema: Secondary | ICD-10-CM

## 2024-03-05 LAB — PULMONARY FUNCTION TEST
DL/VA % pred: 102 %
DL/VA: 4.31 ml/min/mmHg/L
DLCO cor % pred: 88 %
DLCO cor: 17.37 ml/min/mmHg
DLCO unc % pred: 88 %
DLCO unc: 17.37 ml/min/mmHg
FEF 25-75 Post: 1.3 L/s
FEF 25-75 Pre: 1.37 L/s
FEF2575-%Change-Post: -5 %
FEF2575-%Pred-Post: 60 %
FEF2575-%Pred-Pre: 64 %
FEV1-%Change-Post: -3 %
FEV1-%Pred-Post: 79 %
FEV1-%Pred-Pre: 81 %
FEV1-Post: 1.9 L
FEV1-Pre: 1.96 L
FEV1FVC-%Change-Post: -2 %
FEV1FVC-%Pred-Pre: 95 %
FEV6-%Change-Post: 0 %
FEV6-%Pred-Post: 85 %
FEV6-%Pred-Pre: 86 %
FEV6-Post: 2.59 L
FEV6-Pre: 2.61 L
FEV6FVC-%Change-Post: 0 %
FEV6FVC-%Pred-Post: 102 %
FEV6FVC-%Pred-Pre: 102 %
FVC-%Change-Post: 0 %
FVC-%Pred-Post: 84 %
FVC-%Pred-Pre: 84 %
FVC-Post: 2.63 L
FVC-Pre: 2.65 L
Post FEV1/FVC ratio: 72 %
Post FEV6/FVC ratio: 98 %
Pre FEV1/FVC ratio: 74 %
Pre FEV6/FVC Ratio: 98 %
RV % pred: 114 %
RV: 2.34 L
TLC % pred: 96 %
TLC: 4.82 L

## 2024-03-05 NOTE — Patient Instructions (Signed)
 Full PFT performed today.

## 2024-03-05 NOTE — Progress Notes (Signed)
 Full PFT performed today.

## 2024-03-21 IMAGING — CT CT CHEST-ABD-PELV W/ CM
1 of 3 series · 11 of 32 positions shown, 15 images · IV contrast (agent unspecified)
Comparison: Correlation is made with the MRI of the abdomen dated
March 24, 2021 and CT of the abdomen and pelvis dated August 17, 2020
COMPARISON: Correlation is made with the MRI of the abdomen dated
March 24, 2021 and CT of the abdomen and pelvis dated August 17, 2020

Addendum:
CLINICAL DATA: Epigastric mass, pain when moving lower back
surgery. Current smoker.

EXAM:
CT CHEST, ABDOMEN, AND PELVIS WITH CONTRAST
TECHNIQUE: Multidetector CT imaging of the chest, abdomen and pelvis was
performed following the standard protocol during bolus
administration of intravenous contrast.

[Series 5: super d · axial · 0.83mm/px · z∈[-484,+66]mm · 11 of 778 slices shown, 15 images]
[im 63/778  soft-tissue]
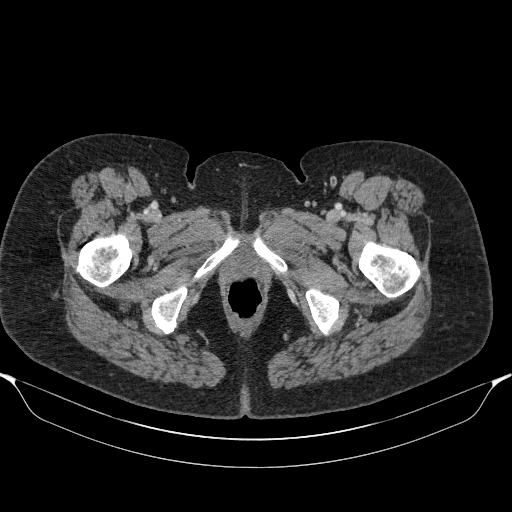
[im 63/778  bone]
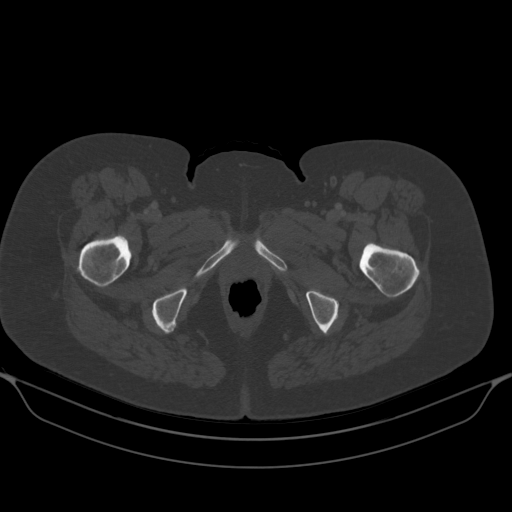
[im 156/778  soft-tissue]
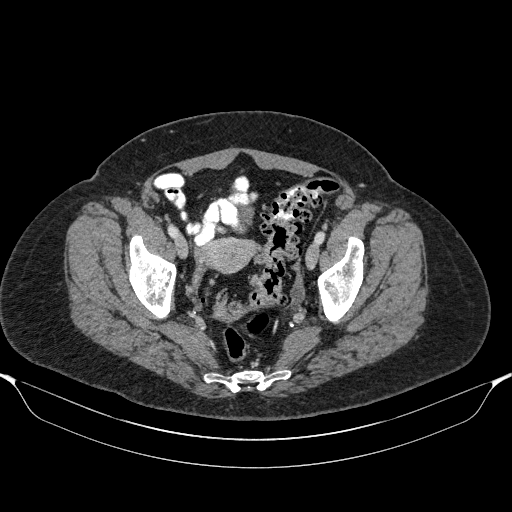
[im 218/778  soft-tissue]
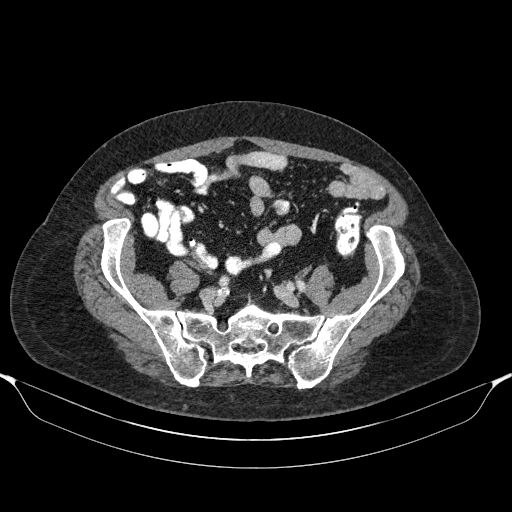
[im 311/778  soft-tissue]
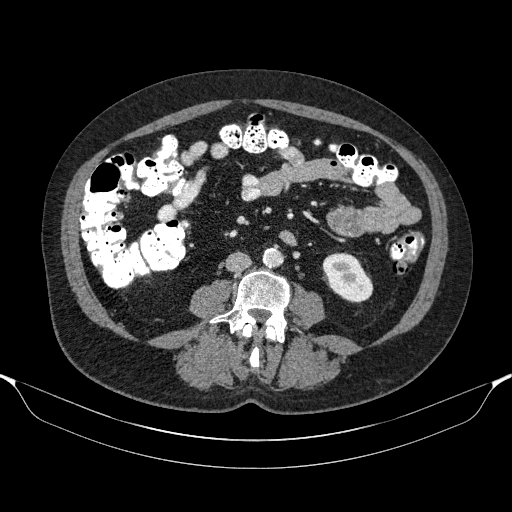
[im 405/778  soft-tissue]
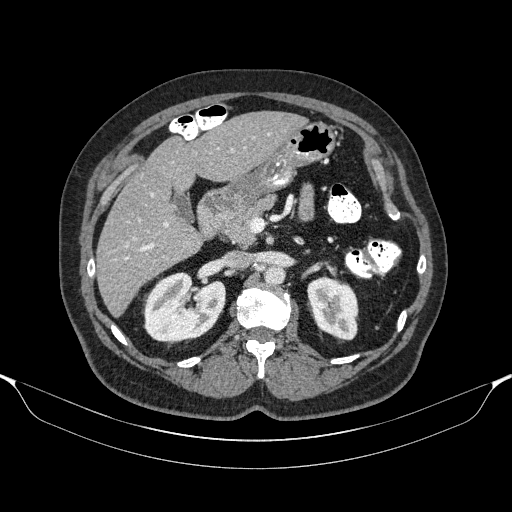
[im 467/778  soft-tissue]
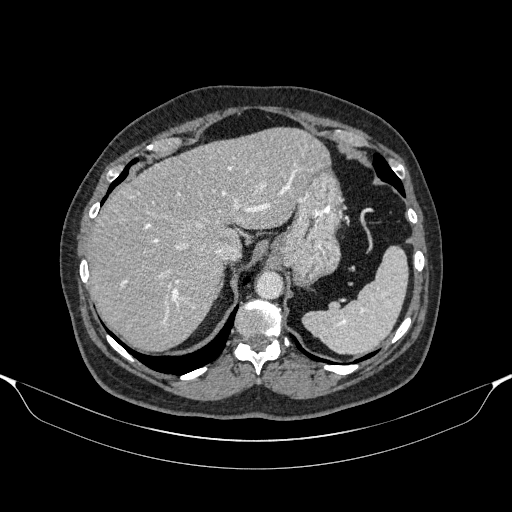
[im 560/778  soft-tissue]
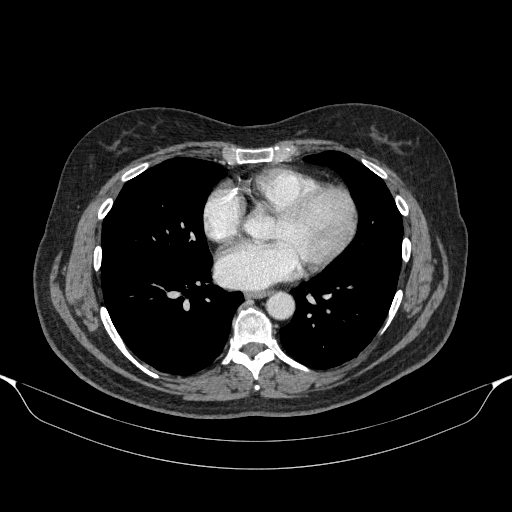
[im 653/778  soft-tissue]
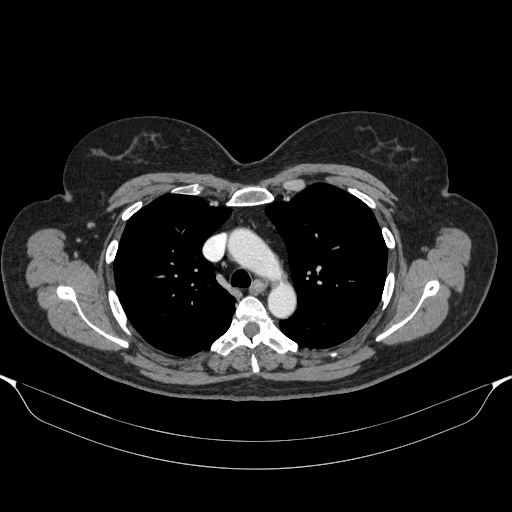
[im 653/778  lung]
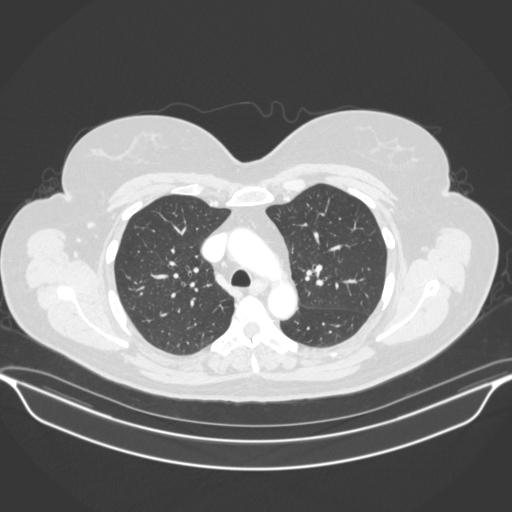
[im 684/778  lung]
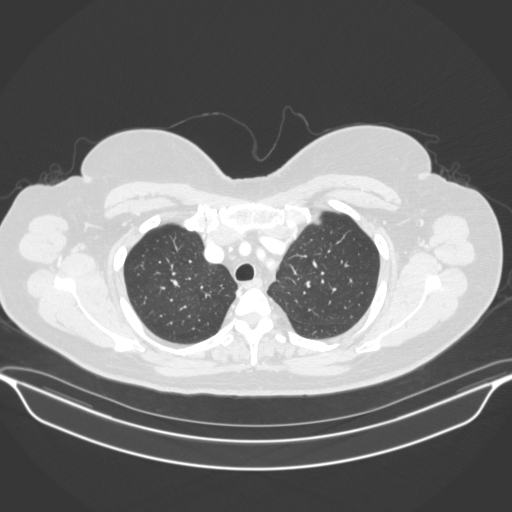
[im 715/778  soft-tissue]
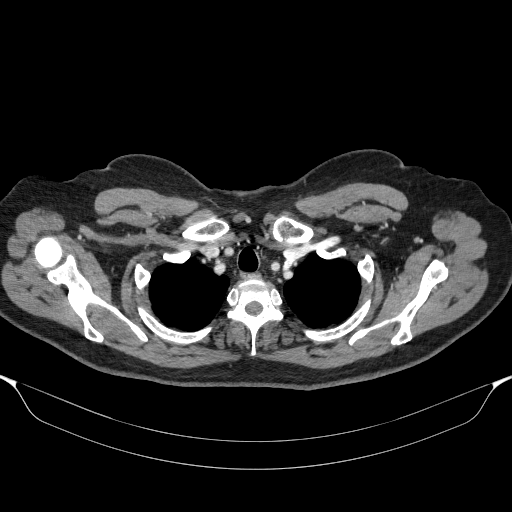
[im 715/778  lung]
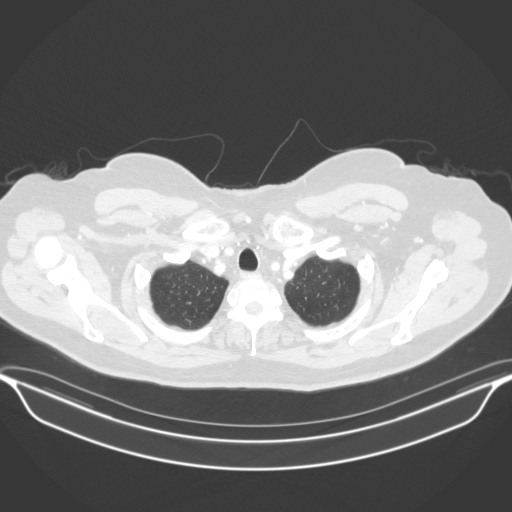
[im 715/778  bone]
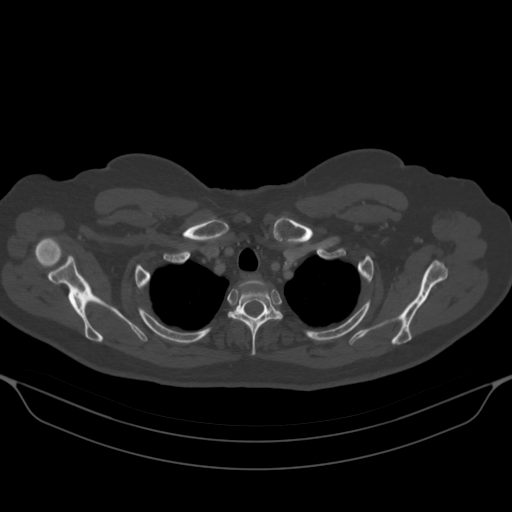
[im 746/778  lung]
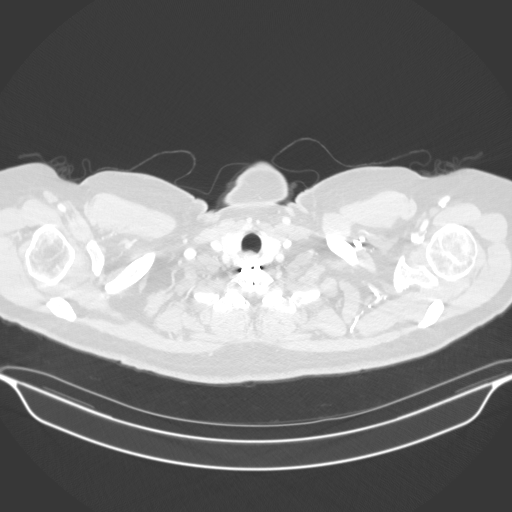

[11 of 32 positions shown; findings below may reference images not displayed]

RADIATION DOSE REDUCTION: This exam was performed according to the
departmental dose-optimization program which includes automated
exposure control, adjustment of the mA and/or kV according to
patient size and/or use of iterative reconstruction technique.

CONTRAST:  100mL 5NJSQ4-3DD IOPAMIDOL (5NJSQ4-3DD) INJECTION 61%
FINDINGS: CT CHEST FINDINGS

Cardiovascular: No significant vascular findings except for moderate
coronary artery calcifications. Normal heart size. No pericardial
effusion.

Mediastinum/Nodes: No enlarged mediastinal, hilar, or axillary lymph
nodes. Thyroid gland, trachea, and esophagus demonstrate no
significant findings.

Lungs/Pleura: Lungs are clear. No pleural effusion or pneumothorax.

Musculoskeletal: No chest wall mass or suspicious bone lesions
identified.

CT ABDOMEN PELVIS FINDINGS

Hepatobiliary: Hepatic steatosis. No discrete mass lesion seen. 3 mm
cyst in the medial segment of the left lobe of the liver. Previously
described ill-defined low-density in the right lobe of the liver is
not identified. Gallbladder has a normal appearance.

Pancreas: Unremarkable. No pancreatic ductal dilatation or
surrounding inflammatory changes.

Spleen: No splenic injury or perisplenic hematoma.

Adrenals/Urinary Tract: Adrenal glands are unremarkable. Left kidney
has a normal appearance. Again seen are the multiple cysts in the
lower aspect of the right kidney with the largest exophytic cyst
measuring 5.5 x 4.8 cm in comparison to [DATE] x 4.4 cm on the previous
study

Stomach/Bowel: Stomach is within normal limits. Appendix appears
normal. No evidence of bowel wall thickening, distention, or
inflammatory changes. Moderate diverticulosis of the sigmoid colon
without diverticulitis. Stable duodenal diverticulum.

Vascular/Lymphatic: Moderate atheromatous calcifications of the
infrarenal abdominal aorta extending into the iliac arteries. No
significant retroperitoneal or pelvic lymphadenopathy.

Reproductive: Uterus and bilateral adnexa are unremarkable.

Other: No abdominal wall hernia or abnormality. No abdominopelvic
ascites.

Musculoskeletal: Mild lumbar spondylosis.
IMPRESSION: CT chest: Moderate coronary artery calcifications. Otherwise the
study is unremarkable.

CT abdomen and pelvis: Hepatic steatosis. No discrete hepatic mass
seen. Duodenal diverticulum. Moderate sigmoid diverticulosis without
diverticulitis. No mass or significant lymphadenopathy seen.

ADDENDUM:
There is a 2 cm transverse x 1.5 cm AP ill-defined soft tissue
density seen about the xiphisternum extending to the right
paramedian aspect. The density of the lesion measures -19 Hounsfield
units. Suggest 1 year follow-up study or surgical consultation if
clinically warranted.

*** End of Addendum ***
RADIATION DOSE REDUCTION: This exam was performed according to the
departmental dose-optimization program which includes automated
exposure control, adjustment of the mA and/or kV according to
patient size and/or use of iterative reconstruction technique.

CONTRAST:  100mL 5NJSQ4-3DD IOPAMIDOL (5NJSQ4-3DD) INJECTION 61%
FINDINGS: CT CHEST FINDINGS

Cardiovascular: No significant vascular findings except for moderate
coronary artery calcifications. Normal heart size. No pericardial
effusion.

Mediastinum/Nodes: No enlarged mediastinal, hilar, or axillary lymph
nodes. Thyroid gland, trachea, and esophagus demonstrate no
significant findings.

Lungs/Pleura: Lungs are clear. No pleural effusion or pneumothorax.

Musculoskeletal: No chest wall mass or suspicious bone lesions
identified.

CT ABDOMEN PELVIS FINDINGS

Hepatobiliary: Hepatic steatosis. No discrete mass lesion seen. 3 mm
cyst in the medial segment of the left lobe of the liver. Previously
described ill-defined low-density in the right lobe of the liver is
not identified. Gallbladder has a normal appearance.

Pancreas: Unremarkable. No pancreatic ductal dilatation or
surrounding inflammatory changes.

Spleen: No splenic injury or perisplenic hematoma.

Adrenals/Urinary Tract: Adrenal glands are unremarkable. Left kidney
has a normal appearance. Again seen are the multiple cysts in the
lower aspect of the right kidney with the largest exophytic cyst
measuring 5.5 x 4.8 cm in comparison to [DATE] x 4.4 cm on the previous
study

Stomach/Bowel: Stomach is within normal limits. Appendix appears
normal. No evidence of bowel wall thickening, distention, or
inflammatory changes. Moderate diverticulosis of the sigmoid colon
without diverticulitis. Stable duodenal diverticulum.

Vascular/Lymphatic: Moderate atheromatous calcifications of the
infrarenal abdominal aorta extending into the iliac arteries. No
significant retroperitoneal or pelvic lymphadenopathy.

Reproductive: Uterus and bilateral adnexa are unremarkable.

Other: No abdominal wall hernia or abnormality. No abdominopelvic
ascites.

Musculoskeletal: Mild lumbar spondylosis.
IMPRESSION: CT chest: Moderate coronary artery calcifications. Otherwise the
study is unremarkable.

CT abdomen and pelvis: Hepatic steatosis. No discrete hepatic mass
seen. Duodenal diverticulum. Moderate sigmoid diverticulosis without
diverticulitis. No mass or significant lymphadenopathy seen.

## 2024-04-16 DIAGNOSIS — Z1231 Encounter for screening mammogram for malignant neoplasm of breast: Secondary | ICD-10-CM | POA: Diagnosis not present

## 2024-04-24 DIAGNOSIS — M5440 Lumbago with sciatica, unspecified side: Secondary | ICD-10-CM | POA: Diagnosis not present

## 2024-04-24 DIAGNOSIS — E559 Vitamin D deficiency, unspecified: Secondary | ICD-10-CM | POA: Diagnosis not present

## 2024-04-24 DIAGNOSIS — J439 Emphysema, unspecified: Secondary | ICD-10-CM | POA: Diagnosis not present

## 2024-04-24 DIAGNOSIS — E538 Deficiency of other specified B group vitamins: Secondary | ICD-10-CM | POA: Diagnosis not present

## 2024-04-24 DIAGNOSIS — E1169 Type 2 diabetes mellitus with other specified complication: Secondary | ICD-10-CM | POA: Diagnosis not present

## 2024-04-24 DIAGNOSIS — Z0000 Encounter for general adult medical examination without abnormal findings: Secondary | ICD-10-CM | POA: Diagnosis not present

## 2024-04-24 DIAGNOSIS — M25551 Pain in right hip: Secondary | ICD-10-CM | POA: Diagnosis not present

## 2024-04-24 DIAGNOSIS — I10 Essential (primary) hypertension: Secondary | ICD-10-CM | POA: Diagnosis not present

## 2024-04-24 DIAGNOSIS — E785 Hyperlipidemia, unspecified: Secondary | ICD-10-CM | POA: Diagnosis not present

## 2024-04-24 DIAGNOSIS — I700 Atherosclerosis of aorta: Secondary | ICD-10-CM | POA: Diagnosis not present

## 2024-04-26 ENCOUNTER — Telehealth: Payer: Self-pay

## 2024-04-26 NOTE — Telephone Encounter (Signed)
 Copied from CRM #[id number]. Topic: Clinical - Lab/Test Results >> Apr 26, 2024 12:35 PM Rozanna G wrote: Reason for CRM: pt called she has not rec'd test results from the PFT test that was done back in July. Pt would like a call back in regards to those results.  Dr. Neda can you please advise of PFT results.

## 2024-05-09 DIAGNOSIS — R202 Paresthesia of skin: Secondary | ICD-10-CM | POA: Diagnosis not present

## 2024-05-09 DIAGNOSIS — M25551 Pain in right hip: Secondary | ICD-10-CM | POA: Diagnosis not present

## 2024-05-09 DIAGNOSIS — R200 Anesthesia of skin: Secondary | ICD-10-CM | POA: Diagnosis not present

## 2024-05-10 ENCOUNTER — Encounter: Payer: Self-pay | Admitting: Emergency Medicine

## 2024-05-10 DIAGNOSIS — S76011A Strain of muscle, fascia and tendon of right hip, initial encounter: Secondary | ICD-10-CM | POA: Diagnosis not present

## 2024-05-14 DIAGNOSIS — S76011A Strain of muscle, fascia and tendon of right hip, initial encounter: Secondary | ICD-10-CM | POA: Diagnosis not present

## 2024-05-21 DIAGNOSIS — S76011A Strain of muscle, fascia and tendon of right hip, initial encounter: Secondary | ICD-10-CM | POA: Diagnosis not present

## 2024-05-28 DIAGNOSIS — S76011A Strain of muscle, fascia and tendon of right hip, initial encounter: Secondary | ICD-10-CM | POA: Diagnosis not present

## 2024-06-04 DIAGNOSIS — S76011A Strain of muscle, fascia and tendon of right hip, initial encounter: Secondary | ICD-10-CM | POA: Diagnosis not present

## 2024-06-06 DIAGNOSIS — M25551 Pain in right hip: Secondary | ICD-10-CM | POA: Diagnosis not present

## 2024-06-06 DIAGNOSIS — M79641 Pain in right hand: Secondary | ICD-10-CM | POA: Diagnosis not present

## 2024-06-11 DIAGNOSIS — S76011A Strain of muscle, fascia and tendon of right hip, initial encounter: Secondary | ICD-10-CM | POA: Diagnosis not present

## 2024-06-18 DIAGNOSIS — S76011A Strain of muscle, fascia and tendon of right hip, initial encounter: Secondary | ICD-10-CM | POA: Diagnosis not present

## 2024-11-21 ENCOUNTER — Ambulatory Visit: Admitting: Internal Medicine
